# Patient Record
Sex: Female | Born: 1965 | Hispanic: No | Marital: Married | Smoking: Never smoker
Health system: Southern US, Community
[De-identification: ages and names within clinical notes are randomized; demographics above are authoritative.]

## PROBLEM LIST (undated history)

## (undated) DIAGNOSIS — Z923 Personal history of irradiation: Secondary | ICD-10-CM

## (undated) DIAGNOSIS — C50311 Malignant neoplasm of lower-inner quadrant of right female breast: Principal | ICD-10-CM

## (undated) DIAGNOSIS — Z8709 Personal history of other diseases of the respiratory system: Secondary | ICD-10-CM

## (undated) HISTORY — DX: Malignant neoplasm of lower-inner quadrant of right female breast: C50.311

---

## 2015-10-05 ENCOUNTER — Other Ambulatory Visit: Payer: Self-pay | Admitting: Radiology

## 2015-10-10 ENCOUNTER — Encounter: Payer: Self-pay | Admitting: *Deleted

## 2015-10-10 ENCOUNTER — Telehealth: Payer: Self-pay | Admitting: *Deleted

## 2015-10-10 DIAGNOSIS — C50311 Malignant neoplasm of lower-inner quadrant of right female breast: Secondary | ICD-10-CM

## 2015-10-10 DIAGNOSIS — Z853 Personal history of malignant neoplasm of breast: Secondary | ICD-10-CM | POA: Insufficient documentation

## 2015-10-10 HISTORY — DX: Malignant neoplasm of lower-inner quadrant of right female breast: C50.311

## 2015-10-10 NOTE — Telephone Encounter (Signed)
Confirmed [facility] for 10/12/15 at 0815.  Instructions and contact information given.

## 2015-10-12 ENCOUNTER — Encounter: Payer: Self-pay | Admitting: Hematology

## 2015-10-12 ENCOUNTER — Telehealth: Payer: Self-pay | Admitting: Hematology

## 2015-10-12 ENCOUNTER — Ambulatory Visit: Payer: 59 | Admitting: Physical Therapy

## 2015-10-12 ENCOUNTER — Telehealth: Payer: Self-pay | Admitting: *Deleted

## 2015-10-12 ENCOUNTER — Other Ambulatory Visit: Payer: 59

## 2015-10-12 ENCOUNTER — Ambulatory Visit (HOSPITAL_BASED_OUTPATIENT_CLINIC_OR_DEPARTMENT_OTHER): Payer: 59

## 2015-10-12 ENCOUNTER — Ambulatory Visit: Payer: 59 | Admitting: Radiation Oncology

## 2015-10-12 ENCOUNTER — Ambulatory Visit (HOSPITAL_BASED_OUTPATIENT_CLINIC_OR_DEPARTMENT_OTHER): Payer: 59 | Admitting: Hematology

## 2015-10-12 VITALS — BP 104/64 | HR 89 | Temp 98.0°F | Resp 18 | Wt 102.8 lb

## 2015-10-12 DIAGNOSIS — C50311 Malignant neoplasm of lower-inner quadrant of right female breast: Secondary | ICD-10-CM

## 2015-10-12 LAB — COMPREHENSIVE METABOLIC PANEL
ALT: 18 U/L (ref 0–55)
ANION GAP: 9 meq/L (ref 3–11)
AST: 19 U/L (ref 5–34)
Albumin: 3.8 g/dL (ref 3.5–5.0)
Alkaline Phosphatase: 55 U/L (ref 40–150)
BILIRUBIN TOTAL: 0.65 mg/dL (ref 0.20–1.20)
BUN: 15.4 mg/dL (ref 7.0–26.0)
CO2: 25 meq/L (ref 22–29)
CREATININE: 0.7 mg/dL (ref 0.6–1.1)
Calcium: 9.1 mg/dL (ref 8.4–10.4)
Chloride: 105 mEq/L (ref 98–109)
EGFR: >90 mL/min/{1.73_m2} (ref >90)
Glucose: 115 mg/dl (ref 70–140)
Potassium: 4 mEq/L (ref 3.5–5.1)
Sodium: 139 mEq/L (ref 136–145)
TOTAL PROTEIN: 7.1 g/dL (ref 6.4–8.3)

## 2015-10-12 LAB — CBC WITH DIFFERENTIAL/PLATELET
BASO%: 0.2 % (ref 0.0–2.0)
Basophils Absolute: 0 10*3/uL (ref 0.0–0.1)
EOS%: 0.5 % (ref 0.0–7.0)
Eosinophils Absolute: 0 10*3/uL (ref 0.0–0.5)
HEMATOCRIT: 39.5 % (ref 34.8–46.6)
HGB: 14.1 g/dL (ref 11.6–15.9)
LYMPH%: 27.7 % (ref 14.0–49.7)
MCH: 33.4 pg (ref 25.1–34.0)
MCHC: 35.7 g/dL (ref 31.5–36.0)
MCV: 93.6 fL (ref 79.5–101.0)
MONO#: 0.3 10*3/uL (ref 0.1–0.9)
MONO%: 5.4 % (ref 0.0–14.0)
NEUT%: 66.2 % (ref 38.4–76.8)
NEUTROS ABS: 3.9 10*3/uL (ref 1.5–6.5)
PLATELETS: 156 10*3/uL (ref 145–400)
RBC: 4.22 10*6/uL (ref 3.70–5.45)
RDW: 12.4 % (ref 11.2–14.5)
WBC: 5.9 10*3/uL (ref 3.9–10.3)
lymph#: 1.6 10*3/uL (ref 0.9–3.3)

## 2015-10-12 MED ORDER — TAMOXIFEN CITRATE 20 MG PO TABS: 20.0000 mg | ORAL_TABLET | Freq: Every day | ORAL | 2 refills | Status: DC

## 2015-10-12 NOTE — Telephone Encounter (Signed)
Confirmed apt for MRI 9/28 at 845am.

## 2015-10-12 NOTE — Telephone Encounter (Signed)
Gave patient avs report and appointments for October. Central radiology will call re scan.  °

## 2015-10-12 NOTE — Progress Notes (Signed)
[place]  [phone] Fax:([phone]) [place] Note   Patient Care Team: Azucena Fallen, MD as PCP - General (Obstetrics and Gynecology) Kyung Rudd, MD as Consulting Physician (Radiation Oncology) Truitt Merle, MD as Consulting Physician (Hematology) Erroll Luna, MD as Consulting Physician (General Surgery) 10/13/2015  CHIEF COMPLAINTS/PURPOSE OF CONSULTATION:  Newly diagnosed right breast cancer  Oncology History   Breast cancer of lower-inner quadrant of right female breast [facility])   Staging form: Breast, AJCC 7th Edition   - Clinical stage from 10/04/2015: Stage IA (T1c, N0, M0) - Signed by Truitt Merle, MD on 10/12/2015      Breast cancer of lower-inner quadrant of right female breast ([facility])   10/04/2015 Mammogram    Diagnostic mammogram and US showed a 1.6cm lobulated irregular high density mass in the lower inner quadrant right breast, possible chest wall invasion and skin involvement, right axillar [place] (-)      10/05/2015 Initial Diagnosis    Breast cancer of lower-inner quadrant of right female breast ([place])      10/05/2015 Initial Biopsy    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       10/05/2015 Receptors her2    ER 90% +, PR 95%+, strong staining, Her2-, Ki67 10%       HISTORY OF PRESENTING ILLNESS:  Shelly Smith 50 y.o. female is here because of Her recently diagnosed right breast cancer. She is accompanied by her husband to my clinic today.  She noticed a small right breast nodule below the nipple in the summer 2016, no nipple discharge, skin change, pain or other symptoms. She saw her gynecologist a few months later, and breast exam did confirm the breast nodule. Mammogram was ordered, but was not scheduled. She noticed some in skin retraction in the area in earlier this year. She finally scheduled for her mammogram a few weeks ago, which showed a 1.6 cm lobulated mass in the right breast lower inner  quadrant. She underwent ultrasound-guided core needle biopsy the next day, which showed invasive lobular carcinoma, ER/PR strongly positive, HER-2 negative. She was referred to [place] for further management.  She feels well overall. No pain, or other discomfort. She denies any weight loss, change of appetite or energy level lately. She is to have mammograms every other year, no family history of breast cancer or personal history of breast disease or biopsy. She is married, lives with her husband, the relocated from [place] to [place] last spring. She has 2 adult daughters. She is oriented in from [place].   GYN HISTORY  Menarchal: 13 LMP: 08/2015 Contraceptive: no  HRT: no  G2P2: two daughters 18 and 21 yo     MEDICAL HISTORY:  Past Medical History:  Diagnosis Date  . Breast cancer of lower-inner quadrant of right female breast ([place]) 10/10/2015    SURGICAL HISTORY: History reviewed. No pertinent surgical history.  SOCIAL HISTORY: Social History   Social History  . Marital status: Married    Spouse name: N/A  . Number of children: N/A  . Years of education: N/A   Occupational History  . Not on file.   Social History Main Topics  . Smoking status: Never Smoker  . Smokeless tobacco: Never Used  . Alcohol use Not on file     Comment: social drinker   . Drug use: No  . Sexual activity: Not on file   Other Topics Concern  . Not on file   Social  History Narrative  . No narrative on file    FAMILY HISTORY: Family History  Problem Relation Age of Onset  . Hypertension Mother   . Hypertension Father   . Cancer Cousin     brain tumor     ALLERGIES:  has no allergies on file.  MEDICATIONS:  Current Outpatient Prescriptions  Medication Sig Dispense Refill  . tamoxifen (NOLVADEX) 20 MG tablet Take 1 tablet (20 mg total) by mouth daily. 30 tablet 2   No current facility-administered medications for this visit.     REVIEW OF SYSTEMS:   Constitutional: Denies fevers,  chills or abnormal night sweats Eyes: Denies blurriness of vision, double vision or watery eyes Ears, nose, mouth, throat, and face: Denies mucositis or sore throat Respiratory: Denies cough, dyspnea or wheezes Cardiovascular: Denies palpitation, chest discomfort or lower extremity swelling Gastrointestinal:  Denies nausea, heartburn or change in bowel habits Skin: Denies abnormal skin rashes Lymphatics: Denies new lymphadenopathy or easy bruising Neurological:Denies numbness, tingling or new weaknesses Behavioral/Psych: Mood is stable, no new changes  All other systems were reviewed with the patient and are negative.  PHYSICAL EXAMINATION: ECOG PERFORMANCE STATUS: 0 - Asymptomatic  Vitals:   10/12/15 1333  BP: 104/64  Pulse: 89  Resp: 18  Temp: 98 F (36.7 C)   Filed Weights   10/12/15 1333  Weight: 102 lb 12.8 oz (46.6 kg)    GENERAL:alert, no distress and comfortable SKIN: skin color, texture, turgor are normal, no rashes or significant lesions EYES: normal, conjunctiva are pink and non-injected, sclera clear OROPHARYNX:no exudate, no erythema and lips, buccal mucosa, and tongue normal  NECK: supple, thyroid normal size, non-tender, without nodularity LYMPH:  no palpable lymphadenopathy in the cervical, axillary or inguinal LUNGS: clear to auscultation and percussion with normal breathing effort HEART: regular rate & rhythm and no murmurs and no lower extremity edema ABDOMEN:abdomen soft, non-tender and normal bowel sounds Musculoskeletal:no cyanosis of digits and no clubbing  PSYCH: alert & oriented x 3 with fluent speech NEURO: no focal motor/sensory deficits Breasts: Breast inspection showed them to be symmetrical with no nipple discharge. Palpation of the breasts and axilla revealed a 1.0X1.5cm mass below the right breast nipple, firm, no completely movable, slightly tender, (+) skin bruise at the biopsy site, no other obvious mass that I could appreciate.  LABORATORY  DATA:  I have reviewed the data as listed CBC Latest Ref Rng & Units 10/12/2015  WBC 3.9 - 10.3 10e3/uL 5.9  Hemoglobin 11.6 - 15.9 g/dL 14.1  Hematocrit 34.8 - 46.6 % 39.5  Platelets 145 - 400 10e3/uL 156   CMP Latest Ref Rng & Units 10/12/2015  Glucose 70 - 140 mg/dl 115  BUN 7.0 - 26.0 mg/dL 15.4  Creatinine 0.6 - 1.1 mg/dL 0.7  Sodium 136 - 145 mEq/L 139  Potassium 3.5 - 5.1 mEq/L 4.0  CO2 22 - 29 mEq/L 25  Calcium 8.4 - 10.4 mg/dL 9.1  Total Protein 6.4 - 8.3 g/dL 7.1  Total Bilirubin 0.20 - 1.20 mg/dL 0.65  Alkaline Phos 40 - 150 U/L 55  AST 5 - 34 U/L 19  ALT 0 - 55 U/L 18    RADIOGRAPHIC STUDIES: I have personally reviewed the radiological images as listed and agreed with the findings in the report.  Mr Breast Bilateral W Wo Contrast  Result Date: 10/13/2015 CLINICAL DATA:  Patient with recent diagnosis of right breast cancer. For further evaluation. EXAM: BILATERAL BREAST MRI WITH AND WITHOUT CONTRAST TECHNIQUE: Multiplanar, multisequence MR  images of both breasts were obtained prior to and following the intravenous administration of 9 ml of MultiHance. THREE-DIMENSIONAL MR IMAGE RENDERING ON INDEPENDENT WORKSTATION: Three-dimensional MR images were rendered by post-processing of the original MR data on an independent workstation. The three-dimensional MR images were interpreted, and findings are reported in the following complete MRI report for this study. Three dimensional images were evaluated at the independent Dyn[employer] workstation COMPARISON:  Previous exam(s). FINDINGS: Breast composition: c.  Heterogeneous fibroglandular tissue. Background parenchymal enhancement: Mild Right breast: Within the lower outer right breast there is a 1.7 x 1.7 x 1.0 cm irregular enhancing mass. There a few small spicules extending posteriorly from this mass toward the pectoralis muscle. No definitive pectoralis muscle enhancement is identified. There is internal susceptibility artifact within this  mass compatible with associated biopsy marking clip. Within the lateral aspect of the right breast slightly inferiorly, middle to posterior depth there is a 2.0 x 1.4 x 3.6 cm area of non mass enhancement. Left breast: No mass or abnormal enhancement. Lymph nodes: No abnormal appearing lymph nodes. Ancillary findings:  None. IMPRESSION: Irregular enhancing mass within the lower outer right breast compatible with recently biopsied right breast carcinoma. There a few small spicules of this mass extending posteriorly toward the pectoralis muscle. No definitive pectoralis muscle enhancement to suggest invasion identified on current examination. Within the lateral aspect of the right breast there is a 3.6 cm area of suspicious non mass enhancement. RECOMMENDATION: MRI guided core needle biopsy suspicious non mass enhancement within the lateral aspect of the right breast. Treatment plan for known right breast malignancy. BI-RADS CATEGORY  4: Suspicious. Electronically Signed   By: Lovey Newcomer M.D.   On: 10/13/2015 13:25    ASSESSMENT & PLAN: 50 year old premenopausal Shelly Smith woman, presented with a palpable right breast mass for one year.  1. Breast cancer of the lower inner quadrant of right female breast, cT1cN0M0, stage IA, ER/PR strongly positive, HER-2 negative, grade 2 ---We discussed her imaging findings and the biopsy results in great details. -Due to suspicion of her tumor invading pectoralis muscle, I recommend to obtain a breast MRI with and without contrast for further evaluation. This is scheduled for tomorrow morning -I recommend a Oncotype Dx test on the biopsy or surgical sample and we'll make a decision about neoadjuvant or adjuvant chemotherapy based on the Oncotype result. Written material of this test was given to her. She is young and fit, would be a good candidate for chemotherapy if her Oncotype recurrence score is high. -If her surgical sentinel lymph node node positive, I recommend  mammaprint for further risk stratification and guide adjuvant chemotherapy. --Giving her strongly ER and PR positivity of the tumor cells, and her pre-menopause status, I recommend adjuvant endocrine therapy with tamoxifen. The potential side effects, which includes but not limited to, hot flash, skin and vaginal dryness, slightly increased risk of cardiovascular disease and cataract, small risk of thrombosis and endometrial cancer, were discussed with her in great details. Preventive strategies for thrombosis, such as being physically active, using compression stocks, avoid cigarette smoking, etc., were reviewed with her. I also recommend her to follow-up with her gynecologist once a year, and watch for vaginal spotting or bleeding, as a clinically sign of endometrial cancer, etc. She voiced good understanding, and agrees to proceed. -Alternative adjuvant endocrine therapy, such as aromatase inhibitor and overexpression were discussed with her also, given her young age. Due to her very early stage breast cancer, a do not strongly feel  this is necessary. -She has a trip to [place] scheduled for next week, she will return in 3 weeks. She will see her surgeon Dr. Donne Hazel after she returns. -I recommend her to start tamoxifen in next few days, while she is waiting for surgery. She agrees. -We discussed the role of adjuvant breast radiation. We'll refer if she needs radiation. -We also reviewed the breast cancer surveillance, including annual mammogram, and physical exam.  -Her today's lab results reviewed with her, she has normal CBC and CMP.   Plan -breast MRI tomorrow  -start tamoxifen 20 min one daily in the next few days. Prescription was sent to her pharmacy. -I plan to see her back in 4 weeks with lab    Orders Placed This Encounter  Procedures  . MR Breast Bilateral W Wo Contrast    Standing Status:   Future    Number of Occurrences:   1    Standing Expiration Date:   12/11/2016    Order  Specific Question:   If indicated for the ordered procedure, I authorize the administration of contrast media per Radiology protocol    Answer:   Yes    Order Specific Question:   Reason for Exam (SYMPTOM  OR DIAGNOSIS REQUIRED)    Answer:   breast cancer, evaluate local invasion (chest wall and skin)    Order Specific Question:   Preferred imaging location?    Answer:   [facility] (table limit-350 lbs)    Order Specific Question:   What is the patient's sedation requirement?    Answer:   No Sedation    Order Specific Question:   Does the patient have a pacemaker or implanted devices?    Answer:   No    All questions were answered. The patient knows to call the clinic with any problems, questions or concerns. I spent 60 minutes counseling the patient face to face. The total time spent in the appointment was 70 minutes and more than 50% was on counseling.     Truitt Merle, MD 10/12/2015   Addendum Her MRI of the breast was done this morning. I reviewed the results with patient. I recommend her to have MRI guided breast biopsy of the non-mass enhancement in the right breast. But if she prefers mastectomy anyway, I think is acceptable not to biopsy the second lesion. She will discuss with her husband, and let me know tomorrow morning.  Truitt Merle  10/13/2015

## 2015-10-13 ENCOUNTER — Ambulatory Visit (HOSPITAL_COMMUNITY)
Admission: RE | Admit: 2015-10-13 | Discharge: 2015-10-13 | Disposition: A | Discharge status: Home / Self Care | Payer: 59 | Source: Ambulatory Visit | Attending: Hematology | Admitting: Hematology

## 2015-10-13 ENCOUNTER — Telehealth: Payer: Self-pay | Admitting: *Deleted

## 2015-10-13 ENCOUNTER — Encounter: Payer: Self-pay | Admitting: *Deleted

## 2015-10-13 ENCOUNTER — Encounter: Payer: Self-pay | Admitting: Hematology

## 2015-10-13 DIAGNOSIS — C50311 Malignant neoplasm of lower-inner quadrant of right female breast: Secondary | ICD-10-CM | POA: Insufficient documentation

## 2015-10-13 MED ORDER — GADOBENATE DIMEGLUMINE 529 MG/ML IV SOLN
10.0000 mL | Freq: Once | INTRAVENOUS | Status: AC | PRN
  Administered 2015-10-13: 9 mL via INTRAVENOUS

## 2015-10-13 NOTE — Telephone Encounter (Signed)
Received order for Oncotype Testing. Requisition sent to pathology. 

## 2015-10-14 ENCOUNTER — Telehealth: Payer: Self-pay | Admitting: *Deleted

## 2015-10-14 ENCOUNTER — Other Ambulatory Visit: Payer: Self-pay | Admitting: *Deleted

## 2015-10-14 DIAGNOSIS — C50311 Malignant neoplasm of lower-inner quadrant of right female breast: Secondary | ICD-10-CM

## 2015-10-14 NOTE — Telephone Encounter (Signed)
Received MRI bx appt for 11/02/15 and appt with Dr. Donne Hazel on 11/04/15. Gave pt appts as well as instructions and directions. Pt will be leaving for [place] on 10/18/15 and returning on 11/01/15. Denies further needs at this time.

## 2015-10-14 NOTE — Telephone Encounter (Signed)
"  I need to speak with Dr. Burr Medico or her nurse."  Call transferred ext [phone].

## 2015-10-19 ENCOUNTER — Other Ambulatory Visit: Payer: 59

## 2015-10-20 ENCOUNTER — Encounter (HOSPITAL_COMMUNITY): Payer: Self-pay

## 2015-10-21 ENCOUNTER — Telehealth: Payer: Self-pay | Admitting: *Deleted

## 2015-10-21 NOTE — Telephone Encounter (Signed)
Received Oncotype score of 19 Physician team notified

## 2015-10-24 ENCOUNTER — Other Ambulatory Visit: Payer: Self-pay | Admitting: Hematology

## 2015-10-24 DIAGNOSIS — C50311 Malignant neoplasm of lower-inner quadrant of right female breast: Secondary | ICD-10-CM

## 2015-11-02 ENCOUNTER — Ambulatory Visit
Admission: RE | Admit: 2015-11-02 | Discharge: 2015-11-02 | Disposition: A | Discharge status: Home / Self Care | Payer: 59 | Source: Ambulatory Visit | Attending: Hematology | Admitting: Hematology

## 2015-11-02 DIAGNOSIS — C50311 Malignant neoplasm of lower-inner quadrant of right female breast: Secondary | ICD-10-CM

## 2015-11-02 MED ORDER — GADOBENATE DIMEGLUMINE 529 MG/ML IV SOLN
9.0000 mL | Freq: Once | INTRAVENOUS | Status: AC | PRN
  Administered 2015-11-02: 9 mL via INTRAVENOUS

## 2015-11-04 ENCOUNTER — Ambulatory Visit (HOSPITAL_BASED_OUTPATIENT_CLINIC_OR_DEPARTMENT_OTHER): Payer: 59 | Admitting: Hematology

## 2015-11-04 ENCOUNTER — Other Ambulatory Visit: Payer: Self-pay | Admitting: General Surgery

## 2015-11-04 VITALS — BP 108/74 | HR 69 | Temp 97.8°F | Resp 18 | Wt 101.9 lb

## 2015-11-04 DIAGNOSIS — Z170 Estrogen receptor positive status [ER+]: Secondary | ICD-10-CM

## 2015-11-04 DIAGNOSIS — Z79811 Long term (current) use of aromatase inhibitors: Secondary | ICD-10-CM

## 2015-11-04 DIAGNOSIS — C50311 Malignant neoplasm of lower-inner quadrant of right female breast: Secondary | ICD-10-CM

## 2015-11-04 NOTE — Progress Notes (Signed)
[place]  [phone] [phone]  Clinic follow up Note   Patient Care Team: Azucena Fallen, MD as PCP - General (Obstetrics and Gynecology) Kyung Rudd, MD as Consulting Physician (Radiation Oncology) Truitt Merle, MD as Consulting Physician (Hematology) Erroll Luna, MD as Consulting Physician (General Surgery) 11/04/2015  CHIEF COMPLAINT:  Follow up right breast cancer  Oncology History   Breast cancer of lower-inner quadrant of right female breast [facility])   Staging form: Breast, AJCC 7th Edition   - Clinical stage from 10/04/2015: Stage IA (T1c, N0, M0) - Signed by Truitt Merle, MD on 10/12/2015      Breast cancer of lower-inner quadrant of right female breast ([place])   10/04/2015 Mammogram    Diagnostic mammogram and US showed a 1.6cm lobulated irregular high density mass in the lower inner quadrant right breast, possible chest wall invasion and skin involvement, right axillar [place] (-)      10/05/2015 Initial Diagnosis    Breast cancer of lower-inner quadrant of right female breast ([place])      10/05/2015 Initial Biopsy    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       10/05/2015 Receptors her2    ER 90% +, PR 95%+, strong staining, Her2-, Ki67 10%      10/13/2015 Imaging    Bilateral breast MRI with and without contrast showed the known 1.7 x 1.7 x 1.0 cm enhancing mass, and a 2.0 x 1.4 x 3.6 cm area of non-mass enhancement. Left breast was negative. Lymph nodes appear normal.      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 female is here because of Her recently diagnosed right breast cancer. She is accompanied by her husband to my clinic today.  She noticed a small right breast nodule  below the nipple in the summer 2016, no nipple discharge, skin change, pain or other symptoms. She saw her gynecologist a few months later, and breast exam did confirm the breast nodule. Mammogram was ordered, but was not scheduled. She noticed some in skin retraction in the area in earlier this year. She finally scheduled for her mammogram a few weeks ago, which showed a 1.6 cm lobulated mass in the right breast lower inner quadrant. She underwent ultrasound-guided core needle biopsy the next day, which showed invasive lobular carcinoma, ER/PR strongly positive, HER-2 negative. She was referred to [place] for further management.  She feels well overall. No pain, or other discomfort. She denies any weight loss, change of appetite or energy level lately. She is to have mammograms every other year, no family history of breast cancer or personal history of breast disease or biopsy. She is married, lives with her husband, the relocated from [place] to [place] last spring. She has 2 adult daughters. She is oriented in from [place].   GYN HISTORY  Menarchal: 13 LMP: 08/2015 Contraceptive: no  HRT: no  G2P2: two daughters 30 and 60 yo   CURRENT THERAPY: Tamoxifen 20 mg once daily, started on 10/13/2015  INTERIM HISTORY:  Shelly Smith returns for follow-up. She is accompanied by her husband to clinic today. She came back from her trip to [place] last weekend. She underwent the second right  breast biopsy. This ago, and was seen by breast surgeon Dr. Donne Hazel this morning. She feels well, denies any other new symptoms. She has not had. Since August 2017.   MEDICAL HISTORY:  Past Medical History:  Diagnosis Date  . Breast cancer of lower-inner quadrant of right female breast ([place]) 10/10/2015    SURGICAL HISTORY: No past surgical history on file.  SOCIAL HISTORY: Social History   Social History  . Marital status: Married    Spouse name: N/A  . Number of children: N/A  . Years of education: N/A    Occupational History  . Not on file.   Social History Main Topics  . Smoking status: Never Smoker  . Smokeless tobacco: Never Used  . Alcohol use Not on file     Comment: social drinker   . Drug use: No  . Sexual activity: Not on file   Other Topics Concern  . Not on file   Social History Narrative  . No narrative on file    FAMILY HISTORY: Family History  Problem Relation Age of Onset  . Hypertension Mother   . Hypertension Father   . Cancer Cousin     brain tumor     ALLERGIES:  has no allergies on file.  MEDICATIONS:  Current Outpatient Prescriptions  Medication Sig Dispense Refill  . tamoxifen (NOLVADEX) 20 MG tablet Take 1 tablet (20 mg total) by mouth daily. 30 tablet 2   No current facility-administered medications for this visit.     REVIEW OF SYSTEMS:   Constitutional: Denies fevers, chills or abnormal night sweats Eyes: Denies blurriness of vision, double vision or watery eyes Ears, nose, mouth, throat, and face: Denies mucositis or sore throat Respiratory: Denies cough, dyspnea or wheezes Cardiovascular: Denies palpitation, chest discomfort or lower extremity swelling Gastrointestinal:  Denies nausea, heartburn or change in bowel habits Skin: Denies abnormal skin rashes Lymphatics: Denies new lymphadenopathy or easy bruising Neurological:Denies numbness, tingling or new weaknesses Behavioral/Psych: Mood is stable, no new changes  All other systems were reviewed with the patient and are negative.  PHYSICAL EXAMINATION: ECOG PERFORMANCE STATUS: 0 - Asymptomatic  Vitals:   11/04/15 1245  BP: 108/74  Pulse: 69  Resp: 18  Temp: 97.8 F (36.6 C)   Filed Weights   11/04/15 1245  Weight: 101 lb 14.4 oz (46.2 kg)    GENERAL:alert, no distress and comfortable SKIN: skin color, texture, turgor are normal, no rashes or significant lesions EYES: normal, conjunctiva are pink and non-injected, sclera clear OROPHARYNX:no exudate, no erythema and  lips, buccal mucosa, and tongue normal  NECK: supple, thyroid normal size, non-tender, without nodularity LYMPH:  no palpable lymphadenopathy in the cervical, axillary or inguinal LUNGS: clear to auscultation and percussion with normal breathing effort HEART: regular rate & rhythm and no murmurs and no lower extremity edema ABDOMEN:abdomen soft, non-tender and normal bowel sounds Musculoskeletal:no cyanosis of digits and no clubbing  PSYCH: alert & oriented x 3 with fluent speech NEURO: no focal motor/sensory deficits Breasts: Breast inspection showed them to be symmetrical with no nipple discharge. Palpation of the breasts and axilla revealed a 1.0X1.5cm mass below the right breast nipple, firm, no completely movable, slightly tender, (+) skin bruise at the biopsy site, no other obvious mass that I could appreciate.  LABORATORY DATA:  I have reviewed the data as listed CBC Latest Ref Rng & Units 10/12/2015  WBC 3.9 - 10.3 10e3/uL 5.9  Hemoglobin 11.6 - 15.9 g/dL 14.1  Hematocrit 34.8 - 46.6 %  39.5  Platelets 145 - 400 10e3/uL 156   CMP Latest Ref Rng & Units 10/12/2015  Glucose 70 - 140 mg/dl 115  BUN 7.0 - 26.0 mg/dL 15.4  Creatinine 0.6 - 1.1 mg/dL 0.7  Sodium 136 - 145 mEq/L 139  Potassium 3.5 - 5.1 mEq/L 4.0  CO2 22 - 29 mEq/L 25  Calcium 8.4 - 10.4 mg/dL 9.1  Total Protein 6.4 - 8.3 g/dL 7.1  Total Bilirubin 0.20 - 1.20 mg/dL 0.65  Alkaline Phos 40 - 150 U/L 55  AST 5 - 34 U/L 19  ALT 0 - 55 U/L 18   PATHOLOGY REPORT  Diagnosis 11/02/2015 Breast, right, needle core biopsy, lateral - 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 - SEE COMMENT. Microscopic Comment There is a small focus of atypical ductal hyperplasia, supported by a positive stain for E-Cadherin and a negative stain for cytokeratin 5/6. The results were called to the [place] [facility] on 11/03/2015  Diagnosis 10/05/2015 Breast, right, needle  core biopsy INVASIVE DUCTAL CARCINOMA, GRADE 2 Results: IMMUNOHISTOCHEMICAL AND MORPHOMETRIC ANALYSIS PERFORMED MANUALLY Estrogen Receptor: 90%, POSITIVE, STRONG STAINING INTENSITY Progesterone Receptor: 95%, POSITIVE, STRONG STAINING INTENSITY Proliferation Marker Ki67: 10% Results: HER2 - NEGATIVE RATIO OF HER2/CEP17 SIGNALS 1.42 AVERAGE HER2 COPY NUMBER PER CELL 2.20  ONCOTYPE DX RS 19, predicts 10 year distance recurrence risk of 12% with tamoxifen   RADIOGRAPHIC STUDIES: I have personally reviewed the radiological images as listed and agreed with the findings in the report.  Mr Breast Bilateral W Wo Contrast  Result Date: 10/13/2015 CLINICAL DATA:  Patient with recent diagnosis of right breast cancer. For further evaluation. EXAM: BILATERAL BREAST MRI WITH AND WITHOUT CONTRAST TECHNIQUE: Multiplanar, multisequence MR images of both breasts were obtained prior to and following the intravenous administration of 9 ml of MultiHance. THREE-DIMENSIONAL MR IMAGE RENDERING ON INDEPENDENT WORKSTATION: Three-dimensional MR images were rendered by post-processing of the original MR data on an independent workstation. The three-dimensional MR images were interpreted, and findings are reported in the following complete MRI report for this study. Three dimensional images were evaluated at the independent DynaCad workstation COMPARISON:  Previous exam(s). FINDINGS: Breast composition: c.  Heterogeneous fibroglandular tissue. Background parenchymal enhancement: Mild Right breast: Within the lower outer right breast there is a 1.7 x 1.7 x 1.0 cm irregular enhancing mass. There a few small spicules extending posteriorly from this mass toward the pectoralis muscle. No definitive pectoralis muscle enhancement is identified. There is internal susceptibility artifact within this mass compatible with associated biopsy marking clip. Within the lateral aspect of the right breast slightly inferiorly, middle to  posterior depth there is a 2.0 x 1.4 x 3.6 cm area of non mass enhancement. Left breast: No mass or abnormal enhancement. Lymph nodes: No abnormal appearing lymph nodes. Ancillary findings:  None. IMPRESSION: Irregular enhancing mass within the lower outer right breast compatible with recently biopsied right breast carcinoma. There a few small spicules of this mass extending posteriorly toward the pectoralis muscle. No definitive pectoralis muscle enhancement to suggest invasion identified on current examination. Within the lateral aspect of the right breast there is a 3.6 cm area of suspicious non mass enhancement. RECOMMENDATION: MRI guided core needle biopsy suspicious non mass enhancement within the lateral aspect of the right breast. Treatment plan for known right breast malignancy. BI-RADS CATEGORY  4: Suspicious. Electronically Signed   By: Lovey Newcomer M.D.   On: 10/13/2015 13:25   Mm Clip Placement Right  Result Date:  11/02/2015 CLINICAL DATA:  Known right breast cancer. Recent MRI showed non masslike enhancement lateral aspect of the breast. Status post MR guided core biopsy of the right breast. EXAM: DIAGNOSTIC RIGHT MAMMOGRAM POST MRI BIOPSY COMPARISON:  Previous exam(s). FINDINGS: Mammographic images were obtained following MR guided biopsy of non masslike enhancement in the lateral aspect of the right breast. Mammographic images show there is a dumbbell-shaped clip in the lateral aspect of the right breast. IMPRESSION: Status post MR guided core biopsy of the right breast with pathology pending. Final Assessment: Post Procedure Mammograms for Marker Placement Electronically Signed   By: Lillia Mountain M.D.   On: 11/02/2015 11:54   Mr Rt Breast Bx Johnella Moloney Dev 1st Lesion Image Bx Spec Mr Guide  Result Date: 11/02/2015 CLINICAL DATA:  Biopsy proven right breast cancer. Recent MRI showed non masslike enhancement in the lateral aspect of the right breast. EXAM: MRI GUIDED CORE NEEDLE BIOPSY OF THE RIGHT  BREAST TECHNIQUE: Multiplanar, multisequence MR imaging of the right breast was performed both before and after administration of intravenous contrast. CONTRAST:  70m MULTIHANCE GADOBENATE DIMEGLUMINE 529 MG/ML IV SOLN COMPARISON:  Previous exams. FINDINGS: I met with the patient, and we discussed the procedure of MRI guided biopsy, including risks, benefits, and alternatives. Specifically, we discussed the risks of infection, bleeding, tissue injury, clip migration, and inadequate sampling. Informed, written consent was given. The usual time out protocol was performed immediately prior to the procedure. Using sterile technique, 1% Lidocaine, MRI guidance, and a 9 gauge vacuum assisted device, biopsy was performed of non masslike enhancement in lateral aspect of the right breast using a lateral to medial approach. At the conclusion of the procedure, a dumbbell-shaped tissue marker clip was deployed into the biopsy cavity. Follow-up 2-view mammogram was performed and dictated separately. IMPRESSION: MRI guided biopsy of the right breast. No apparent complications. Electronically Signed   By: DLillia MountainM.D.   On: 11/02/2015 11:52    ASSESSMENT & PLAN: 50year old premenopausal [place], presented with a palpable right breast mass for one year.  1. Breast cancer of the lower inner quadrant of right female breast, cT1cN0M0, stage IA, ER/PR strongly positive, HER-2 negative, grade 2, Oncotype RS 19 ---We discussed her breast MRI findings, and her second right breast biopsy results. -She has seen I breast surgeon Dr. week., Who recommended right mastectomy and sentinel lymph node biopsy. Patient is agreeable with the plan, her surgery will be scheduled soon. -She will have a screening mammogram of her left breast before surgery -the Oncotype Dx result was reviewed with her in details. She has intermedia risk based on the recurrence score, which predicts 10 year distant recurrence after 5 years of tamoxifen  12%. The benefit of chemotherapy in the intermedia risk group is small and controversial. Given her relatively low score in the intermedia group, I did not recommend adjuvant chemotherapy. She agrees with the plan -She has started a new adjuvant tamoxifen, tolerating well. Due to slight increased risk of thrombosis from tamoxifen, I recommend her to stop tamoxifen. Days before surgery, and restarted a week after surgery. -We'll wait her final surgical past, to see if she need postmastectomy radiation. -We also reviewed the breast cancer surveillance, including annual mammogram, and physical exam.    Plan -Left breast mammogram -She will have right breast mastectomy and sentinel lymph node biopsy by Dr. WDonne Hazelsoon -She will continue tamoxifen, and hold it a few days before and one week after surgery -I plan to  see her back in 2-3 weeks after her breast surgery. -No adjuvant chemotherapy based on her Oncotype results  All questions were answered. The patient knows to call the clinic with any problems, questions or concerns. I spent 25 minutes counseling the patient face to face. The total time spent in the appointment was 30 minutes and more than 50% was on counseling.  Truitt Merle  11/04/2015

## 2015-11-05 ENCOUNTER — Other Ambulatory Visit: Payer: Self-pay | Admitting: General Surgery

## 2015-11-05 ENCOUNTER — Encounter: Payer: Self-pay | Admitting: Hematology

## 2015-11-05 DIAGNOSIS — Z853 Personal history of malignant neoplasm of breast: Secondary | ICD-10-CM

## 2015-11-09 ENCOUNTER — Ambulatory Visit: Payer: 59 | Admitting: Hematology

## 2015-11-11 ENCOUNTER — Other Ambulatory Visit: Payer: Self-pay | Admitting: General Surgery

## 2015-11-11 DIAGNOSIS — Z170 Estrogen receptor positive status [ER+]: Principal | ICD-10-CM

## 2015-11-11 DIAGNOSIS — C50311 Malignant neoplasm of lower-inner quadrant of right female breast: Secondary | ICD-10-CM

## 2015-11-15 NOTE — Pre-Procedure Instructions (Addendum)
Zhamira Rajski  11/15/2015      [facility] - [place], [place] - [address] [place] [address] [place] [address] [place] [address] [place] [address] [place] [address] Phone: [phone] Fax: [phone]    Your procedure is scheduled on Monday, November 6th, 2017.  Report to [facility] Admitting at 6:30 A.M.   Call this number if you have problems the morning of surgery:  [phone]   Remember:  Do not eat food or drink liquids after midnight.   Please drink Boost Breeze drink 2 hours prior to arrival to short stay.     Take these medicines the morning of surgery with A SIP OF WATER: None.     Stop taking: Aspirin, NSAIDS, Aleve, Naproxen, Ibuprofen, Advil, Motrin, BC's, Goody's, Fish oil, all herbal medications, and all vitamins.    Do not wear jewelry, make-up or nail polish.  Do not wear lotions, powders, or perfumes, or deoderant.  Do not shave 48 hours prior to surgery.    Do not bring valuables to the hospital.  [facility] is not responsible for any belongings or valuables.  Contacts, dentures or bridgework may not be worn into surgery.  Leave your suitcase in the car.  After surgery it may be brought to your room.  For patients admitted to the hospital, discharge time will be determined by your treatment team.  Patients discharged the day of surgery will not be allowed to drive home.   Special instructions:  Preparing for Surgery.   - Preparing For Surgery  Before surgery, you can play an important role. Because skin is not sterile, your skin needs to be as free of germs as possible. You can reduce the number of germs on your skin by washing with CHG (chlorahexidine gluconate) Soap before surgery.  CHG is an antiseptic cleaner which kills germs and bonds with the skin to continue killing germs even after washing.  Please do not use if you have an allergy to CHG or antibacterial soaps. If your skin becomes reddened/irritated stop  using the CHG.  Do not shave (including legs and underarms) for at least 48 hours prior to first CHG shower. It is OK to shave your face.  Please follow these instructions carefully.   1. Shower the NIGHT BEFORE SURGERY and the MORNING OF SURGERY with CHG.   2. If you chose to wash your hair, wash your hair first as usual with your normal shampoo.  3. After you shampoo, rinse your hair and body thoroughly to remove the shampoo.  4. Use CHG as you would any other liquid soap. You can apply CHG directly to the skin and wash gently with a scrungie or a clean washcloth.   5. Apply the CHG Soap to your body ONLY FROM THE NECK DOWN.  Do not use on open wounds or open sores. Avoid contact with your eyes, ears, mouth and genitals (private parts). Wash genitals (private parts) with your normal soap.  6. Wash thoroughly, paying special attention to the area where your surgery will be performed.  7. Thoroughly rinse your body with warm water from the neck down.  8. DO NOT shower/wash with your normal soap after using and rinsing off the CHG Soap.  9. Pat yourself dry with a CLEAN TOWEL.   10. Wear CLEAN PAJAMAS   11. Place CLEAN SHEETS on your bed the night of your first shower and DO NOT SLEEP WITH PETS.  Day of Surgery: Do not apply any deodorants/lotions. Please wear clean clothes to the hospital/surgery center.     Please read over the following fact sheets that you were given.

## 2015-11-16 ENCOUNTER — Encounter (HOSPITAL_COMMUNITY): Payer: Self-pay | Admitting: General Practice

## 2015-11-16 ENCOUNTER — Encounter (HOSPITAL_COMMUNITY)
Admission: RE | Admit: 2015-11-16 | Discharge: 2015-11-16 | Disposition: A | Discharge status: Home / Self Care | Payer: 59 | Source: Ambulatory Visit | Attending: General Surgery | Admitting: General Surgery

## 2015-11-16 DIAGNOSIS — Z01812 Encounter for preprocedural laboratory examination: Secondary | ICD-10-CM | POA: Diagnosis not present

## 2015-11-16 HISTORY — DX: Personal history of other diseases of the respiratory system: Z87.09

## 2015-11-16 LAB — CBC
HEMATOCRIT: 43.6 % (ref 36.0–46.0)
HEMOGLOBIN: 15.3 g/dL — AB (ref 12.0–15.0)
MCH: 33.5 pg (ref 26.0–34.0)
MCHC: 35.1 g/dL (ref 30.0–36.0)
MCV: 95.4 fL (ref 78.0–100.0)
Platelets: 128 10*3/uL — ABNORMAL LOW (ref 150–400)
RBC: 4.57 MIL/uL (ref 3.87–5.11)
RDW: 12.2 % (ref 11.5–15.5)
WBC: 3.8 10*3/uL — AB (ref 4.0–10.5)

## 2015-11-16 LAB — BASIC METABOLIC PANEL
ANION GAP: 4 — AB (ref 5–15)
BUN: 12 mg/dL (ref 6–20)
CHLORIDE: 110 mmol/L (ref 101–111)
CO2: 25 mmol/L (ref 22–32)
Calcium: 9.2 mg/dL (ref 8.9–10.3)
Creatinine, Ser: 0.62 mg/dL (ref 0.44–1.00)
GFR calc non Af Amer: >60 mL/min (ref >60)
Glucose, Bld: 95 mg/dL (ref 65–99)
POTASSIUM: 3.8 mmol/L (ref 3.5–5.1)
SODIUM: 139 mmol/L (ref 135–145)

## 2015-11-16 LAB — HCG, SERUM, QUALITATIVE: Preg, Serum: NEGATIVE

## 2015-11-16 NOTE — Progress Notes (Signed)
PCP - patient denies having a PCP  OB/GYN- Azucena Fallen Cardiologist - denies  EKG/CXR - denies Echo/Stress test/Cardiac Cath - denies  Patient denies chest pain and shortness of breath at PAT appointment.    Patient instructed to drink Boost Breeze 2 hours prior to arriving to short stay.

## 2015-11-20 NOTE — Anesthesia Preprocedure Evaluation (Addendum)
Anesthesia Evaluation  Patient identified by MRN, date of birth, ID band Patient awake    Reviewed: Allergy & Precautions, H&P , Patient's Chart, lab work & pertinent test results, reviewed documented beta blocker date and time   Airway Mallampati: II  TM Distance: >3 FB Neck ROM: full  Mouth opening: Limited Mouth Opening  Dental no notable dental hx.    Pulmonary    Pulmonary exam normal breath sounds clear to auscultation       Cardiovascular  Rhythm:regular Rate:Normal     Neuro/Psych    GI/Hepatic   Endo/Other    Renal/GU      Musculoskeletal   Abdominal   Peds  Hematology   Anesthesia Other Findings   Reproductive/Obstetrics                            Anesthesia Physical Anesthesia Plan  ASA: II  Anesthesia Plan:    Post-op Pain Management: GA combined w/ Regional for post-op pain   Induction: Intravenous  Airway Management Planned: LMA  Additional Equipment:   Intra-op Plan:   Post-operative Plan:   Informed Consent: I have reviewed the patients History and Physical, chart, labs and discussed the procedure including the risks, benefits and alternatives for the proposed anesthesia with the patient or authorized representative who has indicated his/her understanding and acceptance.   Dental Advisory Given  Plan Discussed with: CRNA and Surgeon  Anesthesia Plan Comments: (Discussed GA with LMA, possible sore throat, potential need to switch to ETT, N/V, pulmonary aspiration. Questions answered. )        Anesthesia Quick Evaluation

## 2015-11-21 ENCOUNTER — Ambulatory Visit (HOSPITAL_COMMUNITY)
Admission: RE | Admit: 2015-11-21 | Discharge: 2015-11-21 | Disposition: A | Discharge status: Home / Self Care | Payer: 59 | Source: Ambulatory Visit | Attending: General Surgery | Admitting: General Surgery

## 2015-11-21 ENCOUNTER — Ambulatory Visit (HOSPITAL_COMMUNITY): Payer: 59 | Admitting: Certified Registered Nurse Anesthetist

## 2015-11-21 ENCOUNTER — Encounter (HOSPITAL_COMMUNITY): Payer: 59

## 2015-11-21 ENCOUNTER — Observation Stay (HOSPITAL_COMMUNITY)
Admission: RE | Admit: 2015-11-21 | Discharge: 2015-11-22 | Disposition: A | Discharge status: Home / Self Care | Payer: 59 | Source: Ambulatory Visit | Attending: General Surgery | Admitting: General Surgery

## 2015-11-21 ENCOUNTER — Encounter (HOSPITAL_COMMUNITY): Payer: Self-pay | Admitting: Certified Registered"

## 2015-11-21 ENCOUNTER — Encounter (HOSPITAL_COMMUNITY)
Admission: RE | Disposition: A | Discharge status: Home / Self Care | Payer: Self-pay | Source: Ambulatory Visit | Attending: General Surgery

## 2015-11-21 DIAGNOSIS — C773 Secondary and unspecified malignant neoplasm of axilla and upper limb lymph nodes: Secondary | ICD-10-CM | POA: Diagnosis not present

## 2015-11-21 DIAGNOSIS — Z79810 Long term (current) use of selective estrogen receptor modulators (SERMs): Secondary | ICD-10-CM | POA: Diagnosis not present

## 2015-11-21 DIAGNOSIS — Z9889 Other specified postprocedural states: Secondary | ICD-10-CM | POA: Diagnosis not present

## 2015-11-21 DIAGNOSIS — Z170 Estrogen receptor positive status [ER+]: Secondary | ICD-10-CM | POA: Diagnosis not present

## 2015-11-21 DIAGNOSIS — Z8249 Family history of ischemic heart disease and other diseases of the circulatory system: Secondary | ICD-10-CM | POA: Diagnosis not present

## 2015-11-21 DIAGNOSIS — C50911 Malignant neoplasm of unspecified site of right female breast: Secondary | ICD-10-CM | POA: Diagnosis present

## 2015-11-21 DIAGNOSIS — Z823 Family history of stroke: Secondary | ICD-10-CM | POA: Insufficient documentation

## 2015-11-21 DIAGNOSIS — C50311 Malignant neoplasm of lower-inner quadrant of right female breast: Secondary | ICD-10-CM

## 2015-11-21 DIAGNOSIS — D0511 Intraductal carcinoma in situ of right breast: Secondary | ICD-10-CM | POA: Diagnosis not present

## 2015-11-21 HISTORY — PX: MASTECTOMY W/ SENTINEL NODE BIOPSY: SHX2001

## 2015-11-21 SURGERY — MASTECTOMY WITH SENTINEL LYMPH NODE BIOPSY
Anesthesia: General | Site: Breast | Laterality: Right

## 2015-11-21 MED ORDER — GABAPENTIN 300 MG PO CAPS
300.0000 mg | ORAL_CAPSULE | ORAL | Status: AC
  Administered 2015-11-21: 300 mg via ORAL

## 2015-11-21 MED ORDER — ACETAMINOPHEN 500 MG PO TABS
1000.0000 mg | ORAL_TABLET | ORAL | Status: AC
  Administered 2015-11-21: 1000 mg via ORAL

## 2015-11-21 MED ORDER — DEXAMETHASONE SODIUM PHOSPHATE 10 MG/ML IJ SOLN
INTRAMUSCULAR | Status: AC
  Filled 2015-11-21: qty 1

## 2015-11-21 MED ORDER — HEMOSTATIC AGENTS (NO CHARGE) OPTIME
TOPICAL | Status: DC | PRN
  Administered 2015-11-21: 1 via TOPICAL

## 2015-11-21 MED ORDER — ONDANSETRON HCL 4 MG/2ML IJ SOLN: 4.0000 mg | Freq: Four times a day (QID) | INTRAMUSCULAR | Status: DC | PRN

## 2015-11-21 MED ORDER — MIDAZOLAM HCL 2 MG/2ML IJ SOLN
INTRAMUSCULAR | Status: AC
  Filled 2015-11-21: qty 2

## 2015-11-21 MED ORDER — OXYCODONE HCL 5 MG PO TABS
5.0000 mg | ORAL_TABLET | ORAL | Status: DC | PRN
  Filled 2015-11-21: qty 2

## 2015-11-21 MED ORDER — CEFAZOLIN SODIUM-DEXTROSE 2-4 GM/100ML-% IV SOLN
INTRAVENOUS | Status: AC
  Filled 2015-11-21: qty 100

## 2015-11-21 MED ORDER — PROPOFOL 10 MG/ML IV BOLUS
INTRAVENOUS | Status: DC | PRN
  Administered 2015-11-21: 90 mg via INTRAVENOUS

## 2015-11-21 MED ORDER — CEFAZOLIN SODIUM-DEXTROSE 2-4 GM/100ML-% IV SOLN
2.0000 g | INTRAVENOUS | Status: AC
  Administered 2015-11-21: 2 g via INTRAVENOUS

## 2015-11-21 MED ORDER — TECHNETIUM TC 99M SULFUR COLLOID FILTERED
1.0000 | Freq: Once | INTRAVENOUS | Status: AC | PRN
  Administered 2015-11-21: 1 via INTRADERMAL

## 2015-11-21 MED ORDER — METHYLENE BLUE 0.5 % INJ SOLN
INTRAVENOUS | Status: AC
  Filled 2015-11-21: qty 10

## 2015-11-21 MED ORDER — FENTANYL CITRATE (PF) 100 MCG/2ML IJ SOLN
INTRAMUSCULAR | Status: AC
  Filled 2015-11-21: qty 2

## 2015-11-21 MED ORDER — SODIUM CHLORIDE 0.9 % IJ SOLN
INTRAMUSCULAR | Status: AC
  Filled 2015-11-21: qty 10

## 2015-11-21 MED ORDER — ACETAMINOPHEN 325 MG PO TABS: 650.0000 mg | ORAL_TABLET | Freq: Four times a day (QID) | ORAL | Status: DC | PRN

## 2015-11-21 MED ORDER — ONDANSETRON HCL 4 MG/2ML IJ SOLN
INTRAMUSCULAR | Status: AC
  Filled 2015-11-21: qty 2

## 2015-11-21 MED ORDER — LACTATED RINGERS IV SOLN
INTRAVENOUS | Status: DC | PRN
  Administered 2015-11-21: 07:00:00 via INTRAVENOUS

## 2015-11-21 MED ORDER — GABAPENTIN 300 MG PO CAPS
ORAL_CAPSULE | ORAL | Status: AC
  Filled 2015-11-21: qty 1

## 2015-11-21 MED ORDER — SIMETHICONE 80 MG PO CHEW: 40.0000 mg | CHEWABLE_TABLET | Freq: Four times a day (QID) | ORAL | Status: DC | PRN

## 2015-11-21 MED ORDER — FENTANYL CITRATE (PF) 100 MCG/2ML IJ SOLN
INTRAMUSCULAR | Status: DC | PRN
  Administered 2015-11-21 (×2): 50 ug via INTRAVENOUS

## 2015-11-21 MED ORDER — FENTANYL CITRATE (PF) 100 MCG/2ML IJ SOLN
25.0000 ug | INTRAMUSCULAR | Status: DC | PRN
  Administered 2015-11-21: 25 ug via INTRAVENOUS

## 2015-11-21 MED ORDER — ONDANSETRON 4 MG PO TBDP: 4.0000 mg | ORAL_TABLET | Freq: Four times a day (QID) | ORAL | Status: DC | PRN

## 2015-11-21 MED ORDER — 0.9 % SODIUM CHLORIDE (POUR BTL) OPTIME
TOPICAL | Status: DC | PRN
  Administered 2015-11-21: 1000 mL

## 2015-11-21 MED ORDER — PROPOFOL 10 MG/ML IV BOLUS
INTRAVENOUS | Status: AC
  Filled 2015-11-21: qty 20

## 2015-11-21 MED ORDER — LIDOCAINE 2% (20 MG/ML) 5 ML SYRINGE
INTRAMUSCULAR | Status: DC | PRN
  Administered 2015-11-21: 100 mg via INTRAVENOUS

## 2015-11-21 MED ORDER — SODIUM CHLORIDE 0.9 % IV SOLN
INTRAVENOUS | Status: DC
  Administered 2015-11-21: 10:00:00 via INTRAVENOUS

## 2015-11-21 MED ORDER — ACETAMINOPHEN 650 MG RE SUPP: 650.0000 mg | Freq: Four times a day (QID) | RECTAL | Status: DC | PRN

## 2015-11-21 MED ORDER — ONDANSETRON HCL 4 MG/2ML IJ SOLN
INTRAMUSCULAR | Status: DC | PRN
  Administered 2015-11-21: 4 mg via INTRAVENOUS

## 2015-11-21 MED ORDER — MIDAZOLAM HCL 5 MG/5ML IJ SOLN
INTRAMUSCULAR | Status: DC | PRN
  Administered 2015-11-21: 2 mg via INTRAVENOUS

## 2015-11-21 MED ORDER — DEXAMETHASONE SODIUM PHOSPHATE 10 MG/ML IJ SOLN
INTRAMUSCULAR | Status: DC | PRN
  Administered 2015-11-21: 4 mg via INTRAVENOUS

## 2015-11-21 MED ORDER — MORPHINE SULFATE (PF) 2 MG/ML IV SOLN: 1.0000 mg | INTRAVENOUS | Status: DC | PRN

## 2015-11-21 MED ORDER — ACETAMINOPHEN 500 MG PO TABS
ORAL_TABLET | ORAL | Status: AC
  Filled 2015-11-21: qty 2

## 2015-11-21 MED ORDER — METHOCARBAMOL 500 MG PO TABS
500.0000 mg | ORAL_TABLET | Freq: Four times a day (QID) | ORAL | Status: DC | PRN
  Filled 2015-11-21: qty 1

## 2015-11-21 MED ORDER — LIDOCAINE 2% (20 MG/ML) 5 ML SYRINGE
INTRAMUSCULAR | Status: AC
  Filled 2015-11-21: qty 5

## 2015-11-21 SURGICAL SUPPLY — 56 items
APPLIER CLIP 9.375 MED OPEN (MISCELLANEOUS) ×3
BINDER BREAST LRG (GAUZE/BANDAGES/DRESSINGS) ×3 IMPLANT
BINDER BREAST MEDIUM (GAUZE/BANDAGES/DRESSINGS) ×3 IMPLANT
BINDER BREAST XLRG (GAUZE/BANDAGES/DRESSINGS) IMPLANT
CANISTER SUCTION 2500CC (MISCELLANEOUS) ×3 IMPLANT
CHLORAPREP W/TINT 26ML (MISCELLANEOUS) ×3 IMPLANT
CLIP APPLIE 9.375 MED OPEN (MISCELLANEOUS) ×1 IMPLANT
CLOSURE WOUND 1/2 X4 (GAUZE/BANDAGES/DRESSINGS) ×3
CONT SPEC 4OZ CLIKSEAL STRL BL (MISCELLANEOUS) ×3 IMPLANT
COVER PROBE W GEL 5X96 (DRAPES) ×3 IMPLANT
COVER SURGICAL LIGHT HANDLE (MISCELLANEOUS) IMPLANT
DERMABOND ADVANCED (GAUZE/BANDAGES/DRESSINGS) ×2
DERMABOND ADVANCED .7 DNX12 (GAUZE/BANDAGES/DRESSINGS) ×1 IMPLANT
DRAIN CHANNEL 19F RND (DRAIN) ×3 IMPLANT
DRAPE LAPAROSCOPIC ABDOMINAL (DRAPES) ×3 IMPLANT
ELECT BLADE 4.0 EZ CLEAN MEGAD (MISCELLANEOUS) ×3
ELECT CAUTERY BLADE 6.4 (BLADE) ×3 IMPLANT
ELECT REM PT RETURN 9FT ADLT (ELECTROSURGICAL) ×3
ELECTRODE BLDE 4.0 EZ CLN MEGD (MISCELLANEOUS) ×1 IMPLANT
ELECTRODE REM PT RTRN 9FT ADLT (ELECTROSURGICAL) ×1 IMPLANT
EVACUATOR SILICONE 100CC (DRAIN) ×3 IMPLANT
GAUZE SPONGE 4X4 12PLY STRL (GAUZE/BANDAGES/DRESSINGS) ×3 IMPLANT
GLOVE BIO SURGEON STRL SZ7 (GLOVE) ×6 IMPLANT
GLOVE BIOGEL PI IND STRL 6.5 (GLOVE) ×2 IMPLANT
GLOVE BIOGEL PI IND STRL 7.5 (GLOVE) ×1 IMPLANT
GLOVE BIOGEL PI INDICATOR 6.5 (GLOVE) ×4
GLOVE BIOGEL PI INDICATOR 7.5 (GLOVE) ×2
GLOVE SURG SS PI 6.5 STRL IVOR (GLOVE) ×9 IMPLANT
GOWN STRL REUS W/ TWL LRG LVL3 (GOWN DISPOSABLE) ×3 IMPLANT
GOWN STRL REUS W/TWL LRG LVL3 (GOWN DISPOSABLE) ×6
HEMOSTAT ARISTA ABSORB 3G PWDR (MISCELLANEOUS) ×3 IMPLANT
KIT BASIN OR (CUSTOM PROCEDURE TRAY) ×3 IMPLANT
KIT ROOM TURNOVER OR (KITS) ×3 IMPLANT
MARKER SKIN DUAL TIP RULER LAB (MISCELLANEOUS) ×3 IMPLANT
NEEDLE 18GX1X1/2 (RX/OR ONLY) (NEEDLE) IMPLANT
NEEDLE FILTER BLUNT 18X 1/2SAF (NEEDLE)
NEEDLE FILTER BLUNT 18X1 1/2 (NEEDLE) IMPLANT
NEEDLE HYPO 25GX1X1/2 BEV (NEEDLE) IMPLANT
NS IRRIG 1000ML POUR BTL (IV SOLUTION) ×3 IMPLANT
PACK GENERAL/GYN (CUSTOM PROCEDURE TRAY) ×3 IMPLANT
PAD ABD 8X10 STRL (GAUZE/BANDAGES/DRESSINGS) ×3 IMPLANT
PAD ARMBOARD 7.5X6 YLW CONV (MISCELLANEOUS) ×9 IMPLANT
PIN SAFETY STERILE (MISCELLANEOUS) IMPLANT
SPECIMEN JAR X LARGE (MISCELLANEOUS) ×3 IMPLANT
STAPLER VISISTAT 35W (STAPLE) IMPLANT
STRIP CLOSURE SKIN 1/2X4 (GAUZE/BANDAGES/DRESSINGS) ×6 IMPLANT
SUT ETHILON 2 0 FS 18 (SUTURE) ×6 IMPLANT
SUT MON AB 4-0 PC3 18 (SUTURE) ×3 IMPLANT
SUT SILK 2 0 SH (SUTURE) IMPLANT
SUT VIC AB 3-0 54X BRD REEL (SUTURE) ×1 IMPLANT
SUT VIC AB 3-0 BRD 54 (SUTURE) ×2
SUT VIC AB 3-0 SH 18 (SUTURE) IMPLANT
SUT VIC AB 3-0 SH 8-18 (SUTURE) ×6 IMPLANT
SYR CONTROL 10ML LL (SYRINGE) IMPLANT
TOWEL OR 17X24 6PK STRL BLUE (TOWEL DISPOSABLE) ×3 IMPLANT
TOWEL OR 17X26 10 PK STRL BLUE (TOWEL DISPOSABLE) ×3 IMPLANT

## 2015-11-21 NOTE — H&P (Signed)
50 yof referred by Dr Truitt Merle. she has newly diagnosed right breast cancer. she has right breast mass noted last year. this has increased in size. she has no prior breast history. she has no family history of breast or ovarian cancer. she has no nipple dc. mm last on left dec 16. she underwent dx mm on right that showed a 1.6 cm lobulated mass in liq with possible cw and skin involvement. an [place] of right axilla is negative. 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 oncotype on this specimen is 19. she then underwent mr due to question of cw involvement that shows nl left breast, nl ax nodes and the right breast has a 1.7x1.7x1 cm mass with no definite cw involvement. lateral in the right breast there is a 2x1.4x3.6 cm area of nme. this underwent core biopsy and is adh. she is here with her husband to discuss options. due to delay from trip to [place] she has been on tamoxifen.  Other Problems Elbert Ewings, CMA; 11/04/2015 10:24 AM) Breast Cancer Lump In Breast  Past Surgical History Elbert Ewings, [place]; 11/04/2015 10:24 AM) No pertinent past surgical history  Diagnostic Studies History Elbert Ewings, CMA; 11/04/2015 10:24 AM) Colonoscopy never Mammogram 1-3 years ago  Allergies Elbert Ewings, CMA; 11/04/2015 10:24 AM) No Known Drug Allergies10/20/2017  Medication History Elbert Ewings, CMA; 11/04/2015 10:24 AM) Tamoxifen Citrate (20MG Tablet, Oral) Active. Medications Reconciled  Social History Elbert Ewings, [place]; 11/04/2015 10:24 AM) No alcohol use No caffeine use No drug use Tobacco use Never smoker.  Family History Elbert Ewings, [place]; 11/04/2015 10:24 AM) Cerebrovascular Accident Family Members In General. Heart disease in female family member before age 41 Hypertension Father, Mother, Sister.  Pregnancy / Birth History Elbert Ewings, CMA; 11/04/2015 10:24 AM) Age at menarche 50 years. Age of menopause  [age] Gravida 2 Irregular periods Length (months) of breastfeeding 7-12 Maternal age 51-30  Review of Systems Elbert Ewings CMA; 11/04/2015 10:24 AM) General Not Present- Appetite Loss, Chills, Fatigue, Fever, Night Sweats, Weight Gain and Weight Loss. HEENT Present- Wears glasses/contact lenses. Not Present- Earache, Hearing Loss, Hoarseness, Nose Bleed, Oral Ulcers, Ringing in the Ears, Seasonal Allergies, Sinus Pain, Sore Throat, Visual Disturbances and Yellow Eyes. Respiratory Not Present- Bloody sputum, Chronic Cough, Difficulty Breathing, Snoring and Wheezing. Breast Present- Breast Mass and Skin Changes. Not Present- Breast Pain and Nipple Discharge. Cardiovascular Not Present- Chest Pain, Difficulty Breathing Lying Down, Leg Cramps, Palpitations, Rapid Heart Rate, Shortness of Breath and Swelling of Extremities. Gastrointestinal Not Present- Abdominal Pain, Bloating, Bloody Stool, Change in Bowel Habits, Chronic diarrhea, Constipation, Difficulty Swallowing, Excessive gas, Gets full quickly at meals, Hemorrhoids, Indigestion, Nausea, Rectal Pain and Vomiting. Hematology Present- Easy Bruising. Not Present- Blood Thinners, Excessive bleeding, Gland problems, HIV and Persistent Infections.  Vitals Elbert Ewings CMA; 11/04/2015 10:24 AM) 11/04/2015 10:24 AM Weight: 104 lb Height: 64in Body Surface Area: 1.48 m Body Mass Index: 17.85 kg/m  Temp.: 97.49F(Temporal)  Pulse: 70 (Regular)  BP: 120/68 (Sitting, Left Arm, Standard)  Physical Exam Rolm Bookbinder MD; 11/04/2015 8:06 PM) General Mental Status-Alert. Orientation-Oriented X3. Chest and Lung Exam Chest and lung exam reveals -on auscultation, normal breath sounds, no adventitious sounds and normal vocal resonance. Breast Nipples-No Discharge. Note: 1.5 cm lower pole of breast mass partially fixed, no obvious skin involvement Cardiovascular Cardiovacular examination reveals -normal heart sounds,  regular rate and rhythm with no murmurs. Lymphatic Head & Neck General  Head & Neck Lymphatics: Bilateral - Description - Normal. Axillary General Axillary Region: Bilateral - Description - Normal. Note: no [place] adenopathy   Assessment & Plan Rolm Bookbinder MD; 11/04/2015 8:09 PM) BREAST CANCER OF LOWER-INNER QUADRANT OF RIGHT FEMALE BREAST (C50.311) Story: Right total mastectomy, right ax sn biopsy We discussed the staging and pathophysiology of breast cancer. We discussed all of the different options for treatment for breast cancer including surgery, chemotherapy, radiation therapy, Herceptin, and antiestrogen therapy. We discussed a sentinel lymph node biopsy as she does not appear to having lymph node involvement right now. We discussed the performance of that with injection of radioactive tracer. We discussed that there is a chance of having a positive node with a sentinel lymph node biopsy and we will await the permanent pathology to make any other first further decisions in terms of her treatment. One of these options might be to return to the operating room to perform an axillary lymph node dissection. We discussed up to a 5% risk lifetime of chronic shoulder pain as well as lymphedema associated with a sentinel lymph node biopsy. the area of adh needs to be excised as well as tumor. due to breast size and volume of tissue that will need to be excised I think mastectomy is only option. she does not want to consider reconstruction or seeing a Psychiatric nurse. we discussed mastectomy, recovery, incision, drains, as well as other therapy based on pathology. will proceed soon for surgery

## 2015-11-21 NOTE — Anesthesia Procedure Notes (Signed)
Procedure Name: LMA Insertion Date/Time: 11/21/2015 7:51 AM Performed by: Melina Copa, Citlaly Camplin R Pre-anesthesia Checklist: Patient identified, Emergency Drugs available, Suction available and Patient being monitored Patient Re-evaluated:Patient Re-evaluated prior to inductionOxygen Delivery Method: Circle System Utilized Preoxygenation: Pre-oxygenation with 100% oxygen Intubation Type: IV induction Ventilation: Mask ventilation without difficulty LMA: LMA inserted LMA Size: 3.0 Number of attempts: 1 Airway Equipment and Method: Bite block Placement Confirmation: positive ETCO2 Tube secured with: Tape Dental Injury: Teeth and Oropharynx as per pre-operative assessment

## 2015-11-21 NOTE — Anesthesia Postprocedure Evaluation (Signed)
Anesthesia Post Note  Patient: Amelianna Mitrani  Procedure(s) Performed: Procedure(s) (LRB): RIGHT TOTAL MASTECTOMY WITH RIGHT AXILLARY SENTINEL LYMPH NODE BIOPSY (Right)  Patient location during evaluation: PACU Anesthesia Type: General Level of consciousness: sedated Pain management: satisfactory to patient Vital Signs Assessment: post-procedure vital signs reviewed and stable Respiratory status: spontaneous breathing Cardiovascular status: stable Anesthetic complications: no    Last Vitals:  Vitals:   11/21/15 0945 11/21/15 0952  BP:  122/76  Pulse: 72 69  Resp: 10 12  Temp:  36.7 C    Last Pain:  Vitals:   11/21/15 0955  TempSrc:   PainSc: [facility]

## 2015-11-21 NOTE — Discharge Instructions (Signed)
[facility] °[phone] ° °MASTECTOMY: POST OP INSTRUCTIONS ° °Always review your discharge instruction sheet given to you by the facility where your surgery was performed. °IF YOU HAVE DISABILITY OR FAMILY LEAVE FORMS, YOU MUST BRING THEM TO THE OFFICE FOR PROCESSING.   °DO NOT GIVE THEM TO YOUR DOCTOR. °A prescription for pain medication may be given to you upon discharge.  Take your pain medication as prescribed, if needed.  If narcotic pain medicine is not needed, then you may take acetaminophen (Tylenol), naprosyn (Alleve) or ibuprofen (Advil) as needed. °1. Take your usually prescribed medications unless otherwise directed. °2. If you need a refill on your pain medication, please contact your pharmacy.  They will contact our office to request authorization.  Prescriptions will not be filled after 5pm or on week-ends. °3. You should follow a light diet the first few days after arrival home, such as soup and crackers, etc.  Resume your normal diet the day after surgery. °4. Most patients will experience some swelling and bruising on the chest and underarm.  Ice packs will help.  Swelling and bruising can take several days to resolve. Wear the binder day and night until you return to the office.  °5. It is common to experience some constipation if taking pain medication after surgery.  Increasing fluid intake and taking a stool softener (such as Colace) will usually help or prevent this problem from occurring.  A mild laxative (Milk of Magnesia or Miralax) should be taken according to package instructions if there are no bowel movements after 48 hours. °6. Unless discharge instructions indicate otherwise, leave your bandage dry and in place until your next appointment in 3-5 days.  You may take a limited sponge bath.  No tube baths or showers until the drains are removed.  You may have steri-strips (small skin tapes) in place directly over the incision.  These strips should be left on the  skin for 7-10 days. If you have glue it will come off in next couple week.  Any sutures will be removed at an office visit °7. DRAINS:  If you have drains in place, it is important to keep a list of the amount of drainage produced each day in your drains.  Before leaving the hospital, you should be instructed on drain care.  Call our office if you have any questions about your drains. I will remove your drains when they put out less than 30 cc or ml for 2 consecutive days. °8. ACTIVITIES:  You may resume regular (light) daily activities beginning the next day--such as daily self-care, walking, climbing stairs--gradually increasing activities as tolerated.  You may have sexual intercourse when it is comfortable.  Refrain from any heavy lifting or straining until approved by your doctor. °a. You may drive when you are no longer taking prescription pain medication, you can comfortably wear a seatbelt, and you can safely maneuver your car and apply brakes. °b. RETURN TO WORK:  __________________________________________________________ °9. You should see your doctor in the office for a follow-up appointment approximately 3-5 days after your surgery.  Your doctor’s nurse will typically make your follow-up appointment when she calls you with your pathology report.  Expect your pathology report 3-4business days after surgery. °10. OTHER INSTRUCTIONS: ______________________________________________________________________________________________ ____________________________________________________________________________________________ °WHEN TO CALL YOUR DR Mardi Cannady: °1. Fever over 101.0 °2. Nausea and/or vomiting °3. Extreme swelling or bruising °4. Continued bleeding from incision. °5. Increased pain, redness, or drainage from the incision. °The clinic staff is available   to answer your questions during regular business hours.  Please don’t hesitate to call and ask to speak to one of the nurses for clinical concerns.  If  you have a medical emergency, go to the nearest emergency room or call 911.  A surgeon from [facility] is always on call at the hospital. °[address], [address], [place], [place]  [address] ? [address] [address], [place], [place]   [address] °[phone] ? [phone] ? FAX [phone] °Web site: [email or web address] ° °

## 2015-11-21 NOTE — Transfer of Care (Signed)
Immediate Anesthesia Transfer of Care Note  Patient: Shelly Smith  Procedure(s) Performed: Procedure(s): RIGHT TOTAL MASTECTOMY WITH RIGHT AXILLARY SENTINEL LYMPH NODE BIOPSY (Right)  Patient Location: PACU  Anesthesia Type:GA combined with regional for post-op pain  Level of Consciousness: sedated  Airway & Oxygen Therapy: Patient Spontanous Breathing and Patient connected to nasal cannula oxygen  Post-op Assessment: Report given to RN, Post -op Vital signs reviewed and stable and Patient moving all extremities  Post vital signs: Reviewed and stable  Last Vitals:  Vitals:   11/21/15 0849 11/21/15 0852  BP:  108/61  Pulse:  77  Resp:  14  Temp: 36.4 C     Last Pain:  Vitals:   11/21/15 0611  TempSrc: Oral         Complications: No apparent anesthesia complications

## 2015-11-21 NOTE — Interval H&P Note (Signed)
History and Physical Interval Note:  11/21/2015 7:15 AM  Shelly Smith  has presented today for surgery, with the diagnosis of RIGHT BREAST CANCER  The various methods of treatment have been discussed with the patient and family. After consideration of risks, benefits and other options for treatment, the patient has consented to  Procedure(s): RIGHT TOTAL MASTECTOMY WITH RIGHT AXILLARY SENTINEL LYMPH NODE BIOPSY (N/A) as a surgical intervention .  The patient's history has been reviewed, patient examined, no change in status, stable for surgery.  I have reviewed the patient's chart and labs.  Questions were answered to the patient's satisfaction.     Sarim Rothman

## 2015-11-21 NOTE — Anesthesia Procedure Notes (Addendum)
Anesthesia Regional Block:  Pectoralis block  Pre-Anesthetic Checklist: ,, timeout performed, Correct Patient, Correct Site, Correct Laterality, Correct Procedure, Correct Position, site marked, Risks and benefits discussed, at surgeon's request and post-op pain management  Laterality: Right  Prep: chloraprep       Needles:  Injection technique: Single-shot  Needle Type: Echogenic Needle          Additional Needles:  Procedures: ultrasound guided (picture in chart) Pectoralis block Narrative:  Start time: 11/21/2015 7:03 AM End time: 11/21/2015 7:11 AM Injection made incrementally with aspirations every 5 mL. Anesthesiologist: Lyndle Herrlich  Additional Notes: 20cc .5% Marcaine

## 2015-11-21 NOTE — Op Note (Signed)
Preoperative diagnosis: clinical stage II right breast cancer Postoperative diagnosis: same as above Procedure: Right total mastectomy, right axillary sentinel node biopsy Surgeon: Dr Serita Grammes Anesthesia: general with pectoral block EBL: minimal Drains 1 19 Fr blake drain Specimens: right mastectomy marked short superior long lateral,  right axillary sentinel nodes Complications none Sponge and needle count correct dispo case to recovery stable  Indications: This is a 84 yof yof who presents with right breast cancer with clinically and radiologically negative nodes. We discussed options and have elected to do right total mastectomy with right axillary sentinel node biopsy. She declined reconstruction.   Procedure: After informed consent was obtained the patient was taken to the OR. She was given antibiotics and scds were in place. She was administered technetium in the standard fashion. She had a pectoral block completed She was placed under general anesthesia without complication. She was prepped and draped in the standard sterile surgical fashion. A timeout was performed. I made a large elliptical incision  to remove the skin overlying the tumor. This was near the IM fold.  I then created flaps to the clavicle, parasternal area, below the IM fold and latissimus. The breast and pectoral fascia were then removed. I did remove a small area of muscle as the tumor was very close to it. This was marked as above and passed off the table.  I located several sentinel nodes and these were removed. The highest count was 2611. There was no more radioactivity.  There were no palpable nodes.I obtained hemostasis. I placed a 19 Fr Blake drain and secured these with 2-0 nylon. I then placed Arista in the axilla. I then closed the dermis with multiple 3-0 vicryls and this came together well.  The skin was closed with 4-0 monocryl and glue. steristrips were applied. She tolerated well and was  transferred to pacu stable.

## 2015-11-22 ENCOUNTER — Encounter (HOSPITAL_COMMUNITY): Payer: Self-pay | Admitting: General Surgery

## 2015-11-22 DIAGNOSIS — D0511 Intraductal carcinoma in situ of right breast: Secondary | ICD-10-CM | POA: Diagnosis not present

## 2015-11-22 MED ORDER — OXYCODONE HCL 5 MG PO TABS: 5.0000 mg | ORAL_TABLET | Freq: Four times a day (QID) | ORAL | 0 refills | Status: DC | PRN

## 2015-11-22 NOTE — Progress Notes (Signed)
Pt discharged to home.  Discharge instructions explained to pt.  Pt has no questions at the time of discharge.  Pt states she has all belongings.  IV removed.  Pt ambulated off unit on her own.   

## 2015-11-23 NOTE — Discharge Summary (Signed)
Physician Discharge Summary  Patient ID: Shelly Smith MRN: [id number] DOB/AGE: 1965/03/17 50 y.o.  Admit date: 11/21/2015 Discharge date: 11/23/2015  Admission Diagnoses: Right breast cancer  Discharge Diagnoses:  Active Problems:   Cancer of right female breast [facility])   Discharged Condition: good  Hospital Course: 30 yof underwent right total mastectomy and sn biopsy for right breast cancer. She remained overnight and is doing well. Tolerating diet, drains with expected output, pain well controlled, flaps viable with no hematoma. She will be discharged home with path pending.   Consults: None  Significant Diagnostic Studies: none  Treatments: surgery: right tm, right ax sn biopsy   Disposition: 01-Home or Self Care     Medication List    STOP taking these medications   tamoxifen 20 MG tablet Commonly known as:  NOLVADEX     TAKE these medications   multivitamin with minerals Tabs tablet Take 1 tablet by mouth daily.   oxyCODONE 5 MG immediate release tablet Commonly known as:  Oxy IR/ROXICODONE Take 1 tablet (5 mg total) by mouth every 6 (six) hours as needed for moderate pain, severe pain or breakthrough pain.      Follow-up Information    Chizaram Latino, MD Follow up in 1 week(s).   Specialty:  General Surgery Contact information: [address] [place] [address] [phone]           Signed: Rolm Bookbinder 11/23/2015, 7:59 AM

## 2015-12-02 ENCOUNTER — Ambulatory Visit: Payer: 59 | Attending: General Surgery | Admitting: Physical Therapy

## 2015-12-02 DIAGNOSIS — Z483 Aftercare following surgery for neoplasm: Secondary | ICD-10-CM | POA: Insufficient documentation

## 2015-12-02 DIAGNOSIS — M25611 Stiffness of right shoulder, not elsewhere classified: Secondary | ICD-10-CM | POA: Diagnosis present

## 2015-12-02 DIAGNOSIS — R222 Localized swelling, mass and lump, trunk: Secondary | ICD-10-CM | POA: Diagnosis not present

## 2015-12-02 NOTE — Therapy (Signed)
[place], [place], [address] Phone: [phone]   Fax:  [phone]  Physical Therapy Evaluation  Patient Details  Name: Shelly Smith MRN: [id number] Date of Birth: 1965-05-02 Referring Provider: Dr. Donne Hazel   Encounter Date: 12/02/2015      PT End of Session - 12/02/15 1240    Visit Number 1   Number of Visits 9   Date for PT Re-Evaluation 01/06/16   PT Start Time 0845   PT Stop Time 0935   PT Time Calculation (min) 50 min   Activity Tolerance Patient tolerated treatment well   Behavior During Therapy [facility] for tasks assessed/performed      Past Medical History:  Diagnosis Date  . Breast cancer of lower-inner quadrant of right female breast ([place]) 10/10/2015  . History of bronchitis     Past Surgical History:  Procedure Laterality Date  . MASTECTOMY W/ SENTINEL NODE BIOPSY Right 11/21/2015   Procedure: RIGHT TOTAL MASTECTOMY WITH RIGHT AXILLARY SENTINEL LYMPH NODE BIOPSY;  Surgeon: Rolm Bookbinder, MD;  Location: [place];  Service: General;  Laterality: Right;  . NO PAST SURGERIES      There were no vitals filed for this visit.       Subjective Assessment - 12/02/15 0858    Subjective Feels like she is doing Ok. She still has some tightness in her left axilla and into chest like some "strings pulling"    Pertinent History Right total mastectomy with one sentinel node biopsy 11//06/2015 She will have radiation.  She will not have to had chemotherapy.     Currently in Pain? No/denies            [facility] PT Assessment - 12/02/15 0001      Assessment   Medical Diagnosis right breast cancer    Referring Provider Dr. Donne Hazel    Onset Date/Surgical Date 11/21/15   Hand Dominance Right     Precautions   Precautions Other (comment)     Restrictions   Weight Bearing Restrictions No     Balance Screen   Has the patient fallen in the past 6 months No   Has the patient had a decrease in  activity level because of a fear of falling?  No   Is the patient reluctant to leave their home because of a fear of falling?  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  She will consider using a strap to hold it up into axilla more to provide some compression to full area that she has above where the binder currently sits on her chest    Skin Integrity no open areas    Quick DASH  11.36   pt did  not answer all questions      Sensation   Light Touch Not tested     Coordination   Gross Motor Movements are Fluid and Coordinated Yes     Posture/Postural Control   Posture/Postural Control No significant limitations     AROM   Right Shoulder Flexion 145 Degrees   Right Shoulder ABduction 128 Degrees  in scaption    Right Shoulder Internal Rotation 50 Degrees   Right Shoulder External Rotation 80 Degrees   Left Shoulder Flexion 150 Degrees   Left Shoulder ABduction 165 Degrees   Left Shoulder External Rotation 90 Degrees   Left Shoulder Horizontal ABduction 50 Degrees     Strength   Overall Strength Comments decreased scapular stabalizationon the right  with shoulder hiking visible with right arm elevation    Right Shoulder Flexion 3-/5   Right Shoulder ABduction  3-/5   Left Shoulder Flexion 4/5   Left Shoulder ABduction 4/5     Palpation   Palpation comment guitar string axillary cording in axilla down to medical upper arm.  Also palpable at antecubital fossa and wrist but not painful at this area.            LYMPHEDEMA/ONCOLOGY QUESTIONNAIRE - 12/02/15 [id number]      Type   Cancer Type right breast cancer      Surgeries   Mastectomy Date 11/21/15   Number Lymph Nodes Removed 1     Treatment   Active Chemotherapy Treatment No   Active Radiation Treatment No  will start in a few weeks      What other symptoms do you have   Are you Having Heaviness or Tightness No   Are you having Pain Yes   Are you having pitting edema No   Is it Hard or Difficult finding clothes that fit No   Do you have infections No   Is there Decreased scar mobility --  did not test    Stemmer Sign No     Right Upper Extremity Lymphedema   10 cm Proximal to Olecranon Process 22 cm   Olecranon Process 20 cm   10 cm Proximal to Ulnar Styloid Process 21 cm   Just Proximal to Ulnar Styloid Process 12.6 cm   Across Hand at [employer] 17 cm   At [place] of 2nd Digit 5 cm   Other visible fullness at anterio and lateral right chest      Left Upper Extremity Lymphedema   10 cm Proximal to Olecranon Process 21.5 cm   Olecranon Process 20 cm   10 cm Proximal to Ulnar Styloid Process 20.5 cm   Just Proximal to Ulnar Styloid Process 12.7 cm   Across Hand at [employer] 17 cm   At [place] of 2nd Digit 5 cm           Quick Dash - 12/02/15 0001    Open a tight or new jar --  did not rate    Do heavy household chores (wash walls, wash floors) --  did not rate   Carry a shopping bag or briefcase No difficulty   Wash your back No difficulty   Use a knife to cut food No difficulty   Recreational activities in which you take some force or impact through your arm, shoulder, or hand (golf, hammering, tennis) Mild difficulty   During the past week, to what extent  has your arm, shoulder or hand problem interfered with your normal social activities with family,  friends, neighbors, or groups? Quite a bit   During the past week, to what extent has your arm, shoulder or hand problem limited your work or other regular daily activities Slightly   Arm, shoulder, or hand pain. Mild   Tingling (pins and needles) in your arm, shoulder, or hand Mild   Difficulty Sleeping No difficulty   DASH Score 11.36 %                     PT Education - 12/02/15 1239    Education provided Yes   Education Details where to get a compression sleeve and gauntlet, knitted knockers information,    Person(s) Educated Patient   Methods Explanation;Demonstration;Handout   Comprehension Verbalized understanding                Long Term Clinic Goals - 12/02/15 1258      CC Long Term Goal  #1   Title Patient with verbalize an understanding of lymphedema risk reduction precautions   Time 4   Period Weeks   Status New     CC Long Term Goal  #2   Title Patient will be independent in a home exercise program   Time 4   Period Weeks   Status New     CC Long Term Goal  #3   Title Patient will improve left shoulder abduction to 140 degrees so that she can achieve position needed for radiation therapy.   Baseline 128 on 12/02/2015   Time 4   Period Weeks   Status New     CC Long Term Goal  #4   Title Patient will report a decrease in "pulling" pain by 50% so they can perform daily activities with greater ease   Time 4   Period Weeks   Status New            Plan - 12/02/15 1251    Clinical Impression Statement 50 yo female not quite 2 weeks post right mastectomy with one lymph node removed who is having some fullness at anterior and lateral chest above compression binder and "pulling" in right axilla with palpable axillary cording. She has decreased shoulder range of motion and needs to prepare for radiation theapy.  This is low complexity eval due  to lack of comorbidities    Rehab Potential Good   Clinical Impairments Affecting Rehab Potential none at this time   PT Frequency 2x / week   PT Duration 4 weeks   PT Treatment/Interventions ADLs/Self Care Home Management;Patient/family education;DME Instruction;Manual techniques;Therapeutic activities;Manual lymph drainage;Therapeutic exercise;Compression bandaging;Passive range of motion   PT Next Visit Plan Manual techniques for axilllary cording and post op edema. Progress HEP, Instruct about ABC class and sign pt up if interested     Consulted and Agree with Plan of Care Patient      Patient will benefit from skilled therapeutic intervention in order to improve the following deficits and impairments:  Decreased skin integrity, Increased edema, Decreased knowledge of precautions, Decreased knowledge of use of DME, Decreased strength, Increased fascial restricitons, Decreased range of motion  Visit Diagnosis: Localized swelling, mass and lump, trunk - Plan: PT plan of care cert/re-cert  Stiffness of right shoulder joint - Plan: PT plan of care cert/re-cert  Aftercare following surgery for neoplasm - Plan: PT plan of care cert/re-cert     Problem List Patient Active Problem List   Diagnosis Date Noted  . Cancer of right female breast ([place]) 11/21/2015  . Breast cancer  of lower-inner quadrant of right female breast ([place]) 10/10/2015   Donato Heinz. Owens Shark PT  Norwood Levo 12/02/2015, 1:02 PM  [place], [place], [address] Phone: [phone]   Fax:  [phone]  Name: Shelly Smith MRN: [id number] Date of Birth: May 30, 1965

## 2015-12-02 NOTE — Patient Instructions (Addendum)
Class one compression sleeve and gauntlet for airplane travel  Can purchase locally at [facility] on [address] [employer] on [place] or [employer] - Supine  Hold cane at thighs with both hands, extend arms straight over head. Hold ___ seconds. Repeat ___ times. Do ___ times per day.    Lie on back with right hand behind head, folded towel under right elbow and look to left side. This is the position you will have to be in for radiation, so try to hold it for several minutes.

## 2015-12-07 NOTE — Progress Notes (Addendum)
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 Breast, simple mastectomy, Right - INVASIVE DUCTAL CARCINOMA GRADE I/III, SPANNING 1.6 CM. - DUCTAL CARICNOMA IN SITU, INTERMEDIATE GRADE. - LYMPHOVASCULAR INVASION IS IDENTIFIED. - PERINEURAL INVASION IS IDENTIFIED. - INVASIVE CARCINOMA IS FOCALLY 0.1 CM TO THE ANTERIOR MARGIN. - SEE ONCOLOGY TABLE BELOW. 2. Lymph node, sentinel, biopsy, Right Axillary - METASTATIC CARCINOMA IN 1 OF 1 LYMPH NODE (1/1).  Receptor status: ER (90%) PR (95%) Her 2 (NEG) Ki-67 (10%)  Did patient present with symptoms or was this found on screening mammography?: She noticed a small right breast nodule below the nipple in the summer 2016, no nipple discharge, skin change, pain or other symptoms. She saw her gynecologist a few months later, and breast exam did confirm the breast nodule. Mammogram was ordered, but was not scheduled. She noticed some in skin retraction in the area in earlier this year. She finally scheduled for her mammogram in early September, which showed a 1.6 cm lobulated mass in the right breast lower inner quadrant.  Past/Anticipated interventions by surgeon, if any: 11/21/15 Procedure: Right total mastectomy, right axillary sentinel node biopsy Surgeon: Dr Serita Grammes  Past/Anticipated interventions by medical oncology, if any: Dr. Burr Medico 11/04/15 Plan -She will have right breast mastectomy and sentinel lymph node biopsy by Dr. Donne Hazel soon (done 11/21/15) -She will continue tamoxifen, and hold it a few days before and one week after surgery -I plan to see her back in 2-3  weeks after her breast surgery (She saw Dr. Burr Medico today) -No adjuvant chemotherapy based on her Oncotype results -We'll wait her final surgical path, to see if she need postmastectomy radiation    Lymphedema issues, if any:  She tells me several times a day she has some swelling in her Right upper arm, it will improve on it's own. She has good arm movement, and has been performing exercises recommended to her by PT  Pain issues, if any:  She denies  SAFETY ISSUES:  Prior radiation? No  Pacemaker/ICD? No  Possible current pregnancy? No, she is still having periods last one being 11/24/15. She uses foam for birth control.   Is the patient on methotrexate? No  Current Complaints / other details:   They have several questions regarding radiation and future cancer risk. She has questions regarding side effects of radiation treatment also.  BP 108/73   Pulse 77   Temp 97.8 F (36.6 C)   Wt 102 lb 6.4 oz (46.4 kg)   LMP 08/30/2015   SpO2 100% Comment: room air  BMI 17.58 kg/m    Wt Readings from Last 3 Encounters:  12/13/15 102 lb 6.4 oz (46.4 kg)  12/13/15 104 lb 9.6 oz (47.4 kg)  11/21/15 102 lb (46.3 kg)      Phenix Grein, Stephani Police, RN 12/07/2015,7:38 AM

## 2015-12-12 NOTE — Progress Notes (Signed)
[name]  [phone] [phone]  Clinic follow up Note   Patient Care Team: Azucena Fallen, MD as PCP - General (Obstetrics and Gynecology) Kyung Rudd, MD as Consulting Physician (Radiation Oncology) Truitt Merle, MD as Consulting Physician (Hematology) Erroll Luna, MD as Consulting Physician (General Surgery) 12/13/2015  CHIEF COMPLAINT:  Follow up right breast cancer  Oncology History   Breast cancer of lower-inner quadrant of right female breast [facility])   Staging form: Breast, AJCC 7th Edition   - Clinical stage from 10/04/2015: Stage IA (T1c, N0, M0) - Signed by Truitt Merle, MD on 10/12/2015   - Pathologic stage from 11/21/2015: Stage IIA (T1c, N1a, cM0) - Signed by Truitt Merle, MD on 12/12/2015      Breast cancer of lower-inner quadrant of right female breast ([place])   10/04/2015 Mammogram    Diagnostic mammogram and US showed a 1.6cm lobulated irregular high density mass in the lower inner quadrant right breast, possible chest wall invasion and skin involvement, right axillar [place] (-)      10/05/2015 Initial Diagnosis    Breast cancer of lower-inner quadrant of right female breast ([place])      10/05/2015 Initial Biopsy    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       10/05/2015 Receptors her2    ER 90% +, PR 95%+, strong staining, Her2-, Ki67 10%      10/05/2015 Oncotype testing    Oncotype recurrence score of 19, intermediate risk, predicts 10 year distant recurrence risk of 12% with tamoxifen.      10/13/2015 Imaging    Bilateral breast MRI with and without contrast showed the known 1.7 x 1.7 x 1.0 cm enhancing mass, and a 2.0 x 1.4 x 3.6 cm area of non-mass enhancement. Left breast was negative. Lymph nodes appear normal.      10/13/2015 -  Anti-estrogen oral therapy    Tamoxifen 20 mg once daily      11/02/2015 Pathology Results    MRI guided lateral right breast no masses enhancement biopsy showed atypical  ductal hyperplasia and PASH       11/21/2015 Surgery    Right breast mastectomy and SLN biopsy by Dr. Donne Hazel       11/21/2015 Pathology Results    Right breast mastectomy showed 1.6cm invasive ductal carcinoma, G1, (+) LVI, (+) perineural invasion, one SLN was positive, margins were negative        HISTORY OF PRESENTING ILLNESS:  Shelly Smith 50 y.o. female is here because of Her recently diagnosed right breast cancer. She is accompanied by her husband to my clinic today.  She noticed a small right breast nodule below the nipple in the summer 2016, no nipple discharge, skin change, pain or other symptoms. She saw her gynecologist a few months later, and breast exam did confirm the breast nodule. Mammogram was ordered, but was not scheduled. She noticed some in skin retraction in the area in earlier this year. She finally scheduled for her mammogram a few weeks ago, which showed a 1.6 cm lobulated mass in the right breast lower inner quadrant. She underwent ultrasound-guided core needle biopsy the next day, which showed invasive lobular carcinoma, ER/PR strongly positive, HER-2 negative. She was referred to [place] for further management.  She feels well overall. No pain, or other discomfort. She denies any weight loss, change of appetite or energy level lately. She is to have mammograms every other year, no family history of breast cancer  or personal history of breast disease or biopsy. She is married, lives with her husband, the relocated from [place] to [place] last spring. She has 2 adult daughters. She is oriented in from [place].   GYN HISTORY  Menarchal: 13 LMP: 08/2015 Contraceptive: no  HRT: no  G2P2: two daughters 58 and 70 yo   CURRENT THERAPY: Tamoxifen 20 mg once daily, started on 10/13/2015  INTERIM HISTORY:  Shelly Smith returns for follow-up. She Underwent right mastectomy and sentinel lymph node biopsy on 11/21/2015. She tolerated surgery very well, and went back to work in a week  after her surgery. She denies any significant pain at the incision site, no limited range of motion of her right shoulder, no lymphedema on her right arm, no other new complaints. She held tamoxifen before surgery, and restarted 2 weeks later, she has been tolerating very well.   MEDICAL HISTORY:  Past Medical History:  Diagnosis Date  . Breast cancer of lower-inner quadrant of right female breast ([place]) 10/10/2015  . History of bronchitis     SURGICAL HISTORY: Past Surgical History:  Procedure Laterality Date  . MASTECTOMY W/ SENTINEL NODE BIOPSY Right 11/21/2015   Procedure: RIGHT TOTAL MASTECTOMY WITH RIGHT AXILLARY SENTINEL LYMPH NODE BIOPSY;  Surgeon: Rolm Bookbinder, MD;  Location: [facility];  Service: General;  Laterality: Right;  . NO PAST SURGERIES      SOCIAL HISTORY: Social History   Social History  . Marital status: Married    Spouse name: N/A  . Number of children: N/A  . Years of education: N/A   Occupational History  . Not on file.   Social History Main Topics  . Smoking status: Never Smoker  . Smokeless tobacco: Never Used  . Alcohol use Yes     Comment: social drinker   . Drug use: No  . Sexual activity: Not on file   Other Topics Concern  . Not on file   Social History Narrative  . No narrative on file    FAMILY HISTORY: Family History  Problem Relation Age of Onset  . Hypertension Mother   . Hypertension Father   . Cancer Cousin     brain tumor     ALLERGIES:  is allergic to no known allergies.  MEDICATIONS:  Current Outpatient Prescriptions  Medication Sig Dispense Refill  . tamoxifen (NOLVADEX) 20 MG tablet Take 1 tablet by mouth daily.    . Multiple Vitamin (MULTIVITAMIN WITH MINERALS) TABS tablet Take 1 tablet by mouth daily.    Marland Kitchen oxyCODONE (OXY IR/ROXICODONE) 5 MG immediate release tablet Take 1 tablet (5 mg total) by mouth every 6 (six) hours as needed for moderate pain, severe pain or breakthrough pain. (Patient not taking: Reported on  12/13/2015) 10 tablet 0   No current facility-administered medications for this visit.     REVIEW OF SYSTEMS:   Constitutional: Denies fevers, chills or abnormal night sweats Eyes: Denies blurriness of vision, double vision or watery eyes Ears, nose, mouth, throat, and face: Denies mucositis or sore throat Respiratory: Denies cough, dyspnea or wheezes Cardiovascular: Denies palpitation, chest discomfort or lower extremity swelling Gastrointestinal:  Denies nausea, heartburn or change in bowel habits Skin: Denies abnormal skin rashes Lymphatics: Denies new lymphadenopathy or easy bruising Neurological:Denies numbness, tingling or new weaknesses Behavioral/Psych: Mood is stable, no new changes  All other systems were reviewed with the patient and are negative.  PHYSICAL EXAMINATION: ECOG PERFORMANCE STATUS: 0 - Asymptomatic  Vitals:   12/13/15 0919  BP: 110/65  Pulse: 87  Resp: 18  Temp: 97.4 F (36.3 C)   Filed Weights   12/13/15 0919  Weight: 104 lb 9.6 oz (47.4 kg)    GENERAL:alert, no distress and comfortable SKIN: skin color, texture, turgor are normal, no rashes or significant lesions EYES: normal, conjunctiva are pink and non-injected, sclera clear OROPHARYNX:no exudate, no erythema and lips, buccal mucosa, and tongue normal  NECK: supple, thyroid normal size, non-tender, without nodularity LYMPH:  no palpable lymphadenopathy in the cervical, axillary or inguinal LUNGS: clear to auscultation and percussion with normal breathing effort HEART: regular rate & rhythm and no murmurs and no lower extremity edema ABDOMEN:abdomen soft, non-tender and normal bowel sounds Musculoskeletal:no cyanosis of digits and no clubbing  PSYCH: alert & oriented x 3 with fluent speech NEURO: no focal motor/sensory deficits Breasts: Breast inspection showed Right breast is surgically absent, incision is healing well, no discharge or skin erythema. Exam of left breast and bilateral axillas  showed no palpable mass or adenopathy.  LABORATORY DATA:  I have reviewed the data as listed CBC Latest Ref Rng & Units 12/13/2015 11/16/2015 10/12/2015  WBC 3.9 - 10.3 10e3/uL 3.6(L) 3.8(L) 5.9  Hemoglobin 11.6 - 15.9 g/dL 13.8 15.3(H) 14.1  Hematocrit 34.8 - 46.6 % 39.8 43.6 39.5  Platelets 145 - 400 10e3/uL 121(L) 128(L) 156   CMP Latest Ref Rng & Units 12/13/2015 11/16/2015 10/12/2015  Glucose 70 - 140 mg/dl 121 95 115  BUN 7.0 - 26.0 mg/dL 11.6 12 15.4  Creatinine 0.6 - 1.1 mg/dL 0.7 0.62 0.7  Sodium 136 - 145 mEq/L 140 139 139  Potassium 3.5 - 5.1 mEq/L 4.1 3.8 4.0  Chloride 101 - 111 mmol/L - 110 -  CO2 22 - 29 mEq/L _0 Calcium 8.4 - 10.4 mg/dL 8.8 9.2 9.1  Total Protein 6.4 - 8.3 g/dL 6.7 - 7.1  Total Bilirubin 0.20 - 1.20 mg/dL 0.26 - 0.65  Alkaline Phos 40 - 150 U/L 54 - 55  AST 5 - 34 U/L 23 - 19  ALT 0 - 55 U/L 25 - 18   PATHOLOGY REPORT  Diagnosis 11/02/2015 Breast, right, needle core biopsy, lateral - 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 - SEE COMMENT. Microscopic Comment There is a small focus of atypical ductal hyperplasia, supported by a positive stain for E-Cadherin and a negative stain for cytokeratin 5/6. The results were called to the [facility] on 11/03/2015  Diagnosis 10/05/2015 Breast, right, needle core biopsy INVASIVE DUCTAL CARCINOMA, GRADE 2 Results: IMMUNOHISTOCHEMICAL AND MORPHOMETRIC ANALYSIS PERFORMED MANUALLY Estrogen Receptor: 90%, POSITIVE, STRONG STAINING INTENSITY Progesterone Receptor: 95%, POSITIVE, STRONG STAINING INTENSITY Proliferation Marker Ki67: 10% Results: HER2 - NEGATIVE RATIO OF HER2/CEP17 SIGNALS 1.42 AVERAGE HER2 COPY NUMBER PER CELL 2.20  ONCOTYPE DX RS 19, predicts 10 year distance recurrence risk of 12% with tamoxifen   Diagnosis 11/21/2015 1. Breast, simple mastectomy, Right - INVASIVE DUCTAL CARCINOMA GRADE I/III, SPANNING 1.6  CM. - DUCTAL CARICNOMA IN SITU, INTERMEDIATE GRADE. - LYMPHOVASCULAR INVASION IS IDENTIFIED. - PERINEURAL INVASION IS IDENTIFIED. - INVASIVE CARCINOMA IS FOCALLY 0.1 CM TO THE ANTERIOR MARGIN. - SEE ONCOLOGY TABLE BELOW. 2. Lymph node, sentinel, biopsy, Right Axillary - METASTATIC CARCINOMA IN 1 OF 1 LYMPH NODE (1/1). Microscopic Comment 1. BREAST, INVASIVE TUMOR, WITH LYMPH NODES PRESENT Specimen, including laterality and lymph node sampling (sentinel, non-sentinel): Right breast and right axillary lymph node. Procedure: Simple mastectomy and one lymph node resection. Histologic type: Ductal Grade: I  Tubule formation: 2 Nuclear pleomorphism: 2 Mitotic:1 Tumor size (gross measurement): 1.6 cm Margins: Invasive, distance to closest margin: Focally 0.1 cm to the anterior margin. Lymphovascular invasion: Present. Ductal carcinoma in situ: Present. Grade: Intermediate. Extensive intraductal component: Not identified. Lobular neoplasia: Not identified. Tumor focality: Unifocal Extent of tumor: Confined to breast parenchyma. Lymph nodes: 1 of 3 FINAL for ANAALICIA, REIMANN ([id number]) Microscopic Comment(continued) Examined: 1 Sentinel 0 Non-sentinel 1 Total Lymph nodes with metastasis: 1 Isolated tumor cells (< 0.2 mm): 0 Micrometastasis: (> 0.2 mm and < 2.0 mm): 0 Macrometastasis: (> 2.0 mm): 1 Extracapsular extension: Not identified. Breast prognostic profile: Case [id number] Estrogen receptor: 90%, strong Progesterone receptor: 95%, strong. Her 2 neu: No amplification was detected. The ratio was 1.42. Ki-67: 10% Non-neoplastic breast: Fibrocystic changes, pseudoangiomatous stromal hyperplasia (PASH), and healing biopsy sites. TNM: pT1c, pN1a Comments: There are two lesions containing biopsy clips. The 1.6 cm lesion in the mid/inferior lateral specimen reveals grade I invasive ductal carcinoma. This tumor is focally 0.1 cm to the anterior margin. In addition, there is a 0.8  cm vaguely nodular lesion present lateral to this larger mass, containing a dumbbell shaped clip. Histologic evaluation of this area reveals benign fibrocystic changes and pseudoangiomatous stromal hyperplasia with a healing biopsy site. No carcinoma is identified here. (JBK:gt, 11/22/15)   RADIOGRAPHIC STUDIES: I have personally reviewed the radiological images as listed and agreed with the findings in the report.  No results found.  ASSESSMENT & PLAN: 50 year old premenopausal [place] woman, presented with a palpable right breast mass for one year.  1. Breast cancer of the lower inner quadrant of right female breast, pT1cN1aM0, stage IIA, ER/PR strongly positive, HER-2 negative, grade 2, Oncotype RS 19 --We discussed her surgical pathology findings with patient in details. -She had a complete surgical resection with negative margins, unfortunately the 1 sentinel lymph nodes was positive.  -the Oncotype Dx was done on her initial biopsy before suregery, the result was reviewed with her in details. She has intermedia risk based on the recurrence score, which predicts 10 year distant recurrence after 5 years of tamoxifen 12%. The benefit of chemotherapy in the intermedia risk group is small and controversial. Given her relatively low score in the intermedia group, I did not recommend adjuvant chemotherapy. She agrees with the plan -Her surgical path showed low grade tumor, low KI67, I do not think she needs mammaprint, which will likely be low risk disease if we did  -She started tamoxifen before surgery, has been tolerating well, without significant side effects. We'll continue for 10 years -She is probably perimenopausal, the advantage of ovarian suppression and aromatase inhibitor over tamoxifen is small, and I do not feel strongly she would benefit from OS+AI. Certainly if she became postmenopausal in the next 3 years, I would consider switching her tamoxifen to aromatase inhibitor. -She is  scheduled to see radiation oncologist Dr. Isidore Moos today for post mastectomy radiation, due to her positive lymph nodes. -We discussed breast cancer surveillance. She will continue screening left breast mammogram once a year, I encouraged her to do self exam, and a follow-up with [place] with lab every 3-4 months in the first 2-3 years, then every 6 months afterwards   Plan -Continue tamoxifen -She will likely start adjuvant radiation soon -Lab and follow-up in 3 months  All questions were answered. The patient knows to call the clinic with any problems, questions or concerns.  I spent 25 minutes counseling the patient face to face. The total time spent  in the appointment was 30 minutes and more than 50% was on counseling.  Truitt Merle  12/13/2015

## 2015-12-13 ENCOUNTER — Ambulatory Visit
Admission: RE | Admit: 2015-12-13 | Discharge: 2015-12-13 | Disposition: A | Discharge status: Home / Self Care | Payer: 59 | Source: Ambulatory Visit | Attending: Radiation Oncology | Admitting: Radiation Oncology

## 2015-12-13 ENCOUNTER — Other Ambulatory Visit (HOSPITAL_BASED_OUTPATIENT_CLINIC_OR_DEPARTMENT_OTHER): Payer: 59

## 2015-12-13 ENCOUNTER — Ambulatory Visit (HOSPITAL_BASED_OUTPATIENT_CLINIC_OR_DEPARTMENT_OTHER): Payer: 59 | Admitting: Hematology

## 2015-12-13 ENCOUNTER — Telehealth: Payer: Self-pay | Admitting: Hematology

## 2015-12-13 ENCOUNTER — Encounter: Payer: Self-pay | Admitting: Hematology

## 2015-12-13 ENCOUNTER — Encounter: Payer: Self-pay | Admitting: Radiation Oncology

## 2015-12-13 VITALS — BP 108/73 | HR 77 | Temp 97.8°F | Wt 102.4 lb

## 2015-12-13 VITALS — BP 110/65 | HR 87 | Temp 97.4°F | Resp 18 | Ht 64.0 in | Wt 104.6 lb

## 2015-12-13 DIAGNOSIS — C50511 Malignant neoplasm of lower-outer quadrant of right female breast: Secondary | ICD-10-CM | POA: Insufficient documentation

## 2015-12-13 DIAGNOSIS — Z510 Encounter for antineoplastic radiation therapy: Secondary | ICD-10-CM | POA: Diagnosis not present

## 2015-12-13 DIAGNOSIS — C50311 Malignant neoplasm of lower-inner quadrant of right female breast: Secondary | ICD-10-CM

## 2015-12-13 DIAGNOSIS — Z9011 Acquired absence of right breast and nipple: Secondary | ICD-10-CM | POA: Diagnosis not present

## 2015-12-13 DIAGNOSIS — Z79811 Long term (current) use of aromatase inhibitors: Secondary | ICD-10-CM | POA: Diagnosis not present

## 2015-12-13 DIAGNOSIS — Z170 Estrogen receptor positive status [ER+]: Secondary | ICD-10-CM | POA: Insufficient documentation

## 2015-12-13 LAB — CBC WITH DIFFERENTIAL/PLATELET
BASO%: 0 % (ref 0.0–2.0)
Basophils Absolute: 0 10*3/uL (ref 0.0–0.1)
EOS%: 1.7 % (ref 0.0–7.0)
Eosinophils Absolute: 0.1 10*3/uL (ref 0.0–0.5)
HEMATOCRIT: 39.8 % (ref 34.8–46.6)
HGB: 13.8 g/dL (ref 11.6–15.9)
LYMPH#: 1.5 10*3/uL (ref 0.9–3.3)
LYMPH%: 42 % (ref 14.0–49.7)
MCH: 33 pg (ref 25.1–34.0)
MCHC: 34.7 g/dL (ref 31.5–36.0)
MCV: 95.2 fL (ref 79.5–101.0)
MONO#: 0.2 10*3/uL (ref 0.1–0.9)
MONO%: 6.2 % (ref 0.0–14.0)
NEUT%: 50.1 % (ref 38.4–76.8)
NEUTROS ABS: 1.8 10*3/uL (ref 1.5–6.5)
PLATELETS: 121 10*3/uL — AB (ref 145–400)
RBC: 4.18 10*6/uL (ref 3.70–5.45)
RDW: 12.3 % (ref 11.2–14.5)
WBC: 3.6 10*3/uL — AB (ref 3.9–10.3)

## 2015-12-13 LAB — COMPREHENSIVE METABOLIC PANEL
ALT: 25 U/L (ref 0–55)
ANION GAP: 8 meq/L (ref 3–11)
AST: 23 U/L (ref 5–34)
Albumin: 3.5 g/dL (ref 3.5–5.0)
Alkaline Phosphatase: 54 U/L (ref 40–150)
BILIRUBIN TOTAL: 0.26 mg/dL (ref 0.20–1.20)
BUN: 11.6 mg/dL (ref 7.0–26.0)
CO2: 24 meq/L (ref 22–29)
CREATININE: 0.7 mg/dL (ref 0.6–1.1)
Calcium: 8.8 mg/dL (ref 8.4–10.4)
Chloride: 108 mEq/L (ref 98–109)
EGFR: >90 mL/min/{1.73_m2} (ref >90)
Glucose: 121 mg/dl (ref 70–140)
Potassium: 4.1 mEq/L (ref 3.5–5.1)
Sodium: 140 mEq/L (ref 136–145)
TOTAL PROTEIN: 6.7 g/dL (ref 6.4–8.3)

## 2015-12-13 NOTE — Telephone Encounter (Signed)
Gave patient avs report and appointments for February.  °

## 2015-12-13 NOTE — Progress Notes (Signed)
Radiation Oncology         [phone]) [phone] ________________________________  Initial outpatient Consultation  Name: Shelly Smith MRN: [id number]  Date: 12/13/2015  DOB: [birth year]  OI:NOMV,EHMCNOBS R, MD  Rolm Bookbinder, MD   REFERRING PHYSICIAN: Rolm Bookbinder, MD  DIAGNOSIS:    ICD-9-CM ICD-10-CM   1. Malignant neoplasm of lower-outer quadrant of right breast of female, estrogen receptor positive ([place]) 174.5 C50.511    V86.0 Z17.0    Stage IIA pT1c N1a cM0 Right Breast LOQ Invasive Ductal Carcinoma, ER+ / PR+ / Her2neg, Grade 1  CHIEF COMPLAINT: Here to discuss management of right breast cancer  HISTORY OF PRESENT ILLNESS::Shelly Smith is a 50 y.o. female who presented with a small right breast nodule below the nipple in the summer 2016.  She saw her gynecologist a few months later, and breast exam did confirm the breast nodule. Mammogram was ordered, but was not scheduled. She noticed some in skin retraction in the area in earlier this year. She finally scheduled for her mammogram in early September, which showed a 1.6 cm lobulated mass in the right breast lower inner quadrant. .    Biopsy showed on 10/05/15 Breast, right, needle core biopsy showed INVASIVE DUCTAL CARCINOMA, GRADE 2 Receptor Status: ER(90%), PR (95%), Her2-neu (NEG), Ki-(10%).  9-28 MRI breasts: Irregular enhancing mass within the lower outer right breast compatible with recently biopsied right breast carcinoma. There a few small spicules of this mass extending posteriorly toward the pectoralis muscle. No definitive pectoralis muscle enhancement to suggest invasion identified on current examination.  Within the lateral aspect of the right breast there is a 3.6 cm area of suspicious non mass enhancement.  Biopsy 11/02/15 Breast, right, needle core biopsy, lateral showed:  ATYPICAL DUCTAL HYPERPLASIA. - PSEUDOANGIOMATOUS STROMAL HYPERPLASIA (PASH) - FIBROCYSTIC CHANGES   On 11/21/15 Dr Donne Hazel performed  Breast, simple mastectomy, Right and SLN bx. This revealed: - INVASIVE DUCTAL CARCINOMA GRADE I/III, SPANNING 1.6 CM. - DUCTAL CARICNOMA IN SITU, INTERMEDIATE GRADE. - LYMPHOVASCULAR INVASION IS IDENTIFIED. - PERINEURAL INVASION IS IDENTIFIED. - INVASIVE CARCINOMA IS FOCALLY 0.1 CM TO THE ANTERIOR MARGIN.  2. Lymph node, sentinel, biopsy, Right Axillary - METASTATIC CARCINOMA IN 1 OF 1 LYMPH NODE (1/1).   She received tamoxifen before surgery. No adjuvant chemotherapy based on her Oncotype results.    Lymphedema issues, if any:  She tells me several times a day she has some swelling in her Right upper arm, it will improve on its own. She has good arm movement, and has been performing exercises recommended to her by PT   Pain issues, if any:  She denies   SAFETY ISSUES:  Prior radiation? No  Pacemaker/ICD? No  Possible current pregnancy? Denies; she is still having periods last one being 11/24/15. She uses foam for birth control.   Is the patient on methotrexate? No   Current Complaints / other details:   She has several questions regarding radiation and future cancer risk. She has questions regarding side effects of radiation treatment also.     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    PAST SURGICAL HISTORY: Past Surgical History:  Procedure Laterality Date  . MASTECTOMY W/ SENTINEL NODE BIOPSY Right 11/21/2015   Procedure: RIGHT TOTAL MASTECTOMY WITH RIGHT AXILLARY SENTINEL LYMPH NODE BIOPSY;  Surgeon: Rolm Bookbinder, MD;  Location: [facility];  Service: General;  Laterality:  Right;  . NO PAST SURGERIES      FAMILY HISTORY: family history includes Cancer in her cousin; Hypertension in her father and mother.  SOCIAL HISTORY:  reports that she has never smoked. She has never used smokeless tobacco. She reports that she drinks  alcohol. She reports that she does not use drugs.  ALLERGIES: No known allergies  MEDICATIONS:  Current Outpatient Prescriptions  Medication Sig Dispense Refill  . Multiple Vitamin (MULTIVITAMIN WITH MINERALS) TABS tablet Take 1 tablet by mouth daily.    . tamoxifen (NOLVADEX) 20 MG tablet Take 1 tablet by mouth daily.    Marland Kitchen oxyCODONE (OXY IR/ROXICODONE) 5 MG immediate release tablet Take 1 tablet (5 mg total) by mouth every 6 (six) hours as needed for moderate pain, severe pain or breakthrough pain. (Patient not taking: Reported on 12/13/2015) 10 tablet 0   No current facility-administered medications for this encounter.     REVIEW OF SYSTEMS: as above   PHYSICAL EXAM:  weight is 102 lb 6.4 oz (46.4 kg). Her temperature is 97.8 F (36.6 C). Her blood pressure is 108/73 and her pulse is 77. Her oxygen saturation is 100%.   General: Alert and oriented, in no acute distress HEENT: Head is normocephalic. Extraocular movements are intact. Oropharynx is clear. Neck: Neck is supple, no palpable cervical or supraclavicular lymphadenopathy. Heart: Regular in rate and rhythm with no murmurs, rubs, or gallops. Chest: Clear to auscultation bilaterally, with no rhonchi, wheezes, or rales. Abdomen: Soft, nontender, nondistended, with no rigidity or guarding. Extremities: No cyanosis or edema. Lymphatics: see Neck Exam Skin: No concerning lesions. Musculoskeletal: symmetric strength and muscle tone throughout. Neurologic: Cranial nerves II through XII are grossly intact. No obvious focalities. Speech is fluent. Coordination is intact. Psychiatric: Judgment and insight are intact. Affect is appropriate. Breasts: right chest wall, no active draining from scars - adhesive over chest wall scar, currently. No  palpable masses appreciated in the breasts or axillae  .  ECOG = 0  0 - Asymptomatic (Fully active, able to carry on all predisease activities without restriction)  1 - Symptomatic but  completely ambulatory (Restricted in physically strenuous activity but ambulatory and able to carry out work of a light or sedentary nature. For example, light housework, office work)  2 - Symptomatic, <50% in bed during the day (Ambulatory and capable of all self care but unable to carry out any work activities. Up and about more than 50% of waking hours)  3 - Symptomatic, >50% in bed, but not bedbound (Capable of only limited self-care, confined to bed or chair 50% or more of waking hours)  4 - Bedbound (Completely disabled. Cannot carry on any self-care. Totally confined to bed or chair)  5 - Death   Eustace Pen MM, Creech RH, Tormey DC, et al. [phone]). "Toxicity and response criteria of the [facility]". [place] Oncol. 5 (6): 649-55   LABORATORY DATA:  Lab Results  Component Value Date   WBC 3.6 (L) 12/13/2015   HGB 13.8 12/13/2015   HCT 39.8 12/13/2015   MCV 95.2 12/13/2015   PLT 121 (L) 12/13/2015   CMP     Component Value Date/Time   NA 140 12/13/2015 0907   K 4.1 12/13/2015 0907   CL 110 11/16/2015 0900   CO2 24 12/13/2015 0907   GLUCOSE 121 12/13/2015 0907   BUN 11.6 12/13/2015 0907   CREATININE 0.7 12/13/2015 0907   CALCIUM 8.8 12/13/2015 0907   PROT 6.7 12/13/2015 3903  ALBUMIN 3.5 12/13/2015 0907   AST 23 12/13/2015 0907   ALT 25 12/13/2015 0907   ALKPHOS 54 12/13/2015 0907   BILITOT 0.26 12/13/2015 0907   GFRNONAA >60 11/16/2015 0900   GFRAA >60 11/16/2015 0900         RADIOGRAPHY: No results found.    IMPRESSION/PLAN: STAGE IIA right breast cancer, post mastectomy, node +    It was a pleasure meeting the patient today. We discussed the risks, benefits, and side effects of radiotherapy. I recommend radiotherapy to the right chest wall and regional nodes to reduce her risk of locoregional recurrence by 2/3.  If her IM nodes can be targeted without excessive lung exposure I will consider this. Her lesion was near the nipple,  though LOQ  per patient history and MRI report  We discussed that radiation would take approximately 6 weeks to complete.  We spoke about acute effects including skin irritation and fatigue as well as much less common late effects including internal organ injury or irritation. We spoke in great depth about the latest technology that is used to minimize the risk of late effects for patients undergoing radiotherapy to the breast or chest wall. No guarantees of treatment were given. The patient is enthusiastic about proceeding with treatment.  I addressed her concerns regarding the safety of treatment and rare risk of secondary cancers.I look forward to participating in the patient's care. I will order CT simulation/treatment planning for next week. Consent signed.  Extremely Low Risk of pregnancy: Last menstrual cycle was 2.5 weeks ago. Pregnancy test negative earlier this month. I recommended another  pregnancy test today as a precaution. Against my medical advice, she adamantly declines this in our lab and states she will do a test on her own.  She is quite certain she is not pregnant.  I explained the risks of RT to a fetus and explained that she must use contraception while undergoing RT. I believe her risk of pregnancy today is extremely low.  I spent 60 minutes face to face with the patient and more than 50% of that time was spent in counseling and/or coordination of care. __________________________________________   Eppie Gibson, MD

## 2015-12-19 ENCOUNTER — Ambulatory Visit
Admission: RE | Admit: 2015-12-19 | Discharge: 2015-12-19 | Disposition: A | Discharge status: Home / Self Care | Payer: 59 | Source: Ambulatory Visit | Attending: Radiation Oncology | Admitting: Radiation Oncology

## 2015-12-19 DIAGNOSIS — Z170 Estrogen receptor positive status [ER+]: Principal | ICD-10-CM

## 2015-12-19 DIAGNOSIS — Z510 Encounter for antineoplastic radiation therapy: Secondary | ICD-10-CM | POA: Diagnosis not present

## 2015-12-19 DIAGNOSIS — C50511 Malignant neoplasm of lower-outer quadrant of right female breast: Secondary | ICD-10-CM

## 2015-12-19 NOTE — Progress Notes (Signed)
  Radiation Oncology         [phone] ________________________________  Name: Shelly Smith MRN: [id number]  Date: 12/19/2015  DOB: 10/16/1965  SIMULATION AND TREATMENT PLANNING NOTE    Outpatient  DIAGNOSIS:     ICD-9-CM ICD-10-CM   1. Malignant neoplasm of lower-outer quadrant of right breast of female, estrogen receptor positive ([place]) 174.5 C50.511    V86.0 Z17.0     NARRATIVE:  The patient was brought to the [facility].  Identity was confirmed.  All relevant records and images related to the planned course of therapy were reviewed.  The patient freely provided informed written consent to proceed with treatment after reviewing the details related to the planned course of therapy. The consent form was witnessed and verified by the simulation staff.    Then, the patient was set-up in a stable reproducible supine position for radiation therapy with her ipsilateral arm over her head, and her upper body secured in a custom-made Vac-lok device.  CT images were obtained.  Surface markings were placed.  The CT images were loaded into the planning software.    TREATMENT PLANNING NOTE: Treatment planning then occurred.  The radiation prescription was entered and confirmed.     A total of 5 medically necessary complex treatment devices were fabricated and supervised by me: 4 fields with MLCs for custom blocks to protect heart, and lungs;  and, a Vac-lok. MORE COMPLEX DEVICES MAY BE MADE IN DOSIMETRY FOR FIELD IN FIELD BEAMS FOR DOSE HOMOGENEITY.  I have requested : 3D Simulation which is medically necessary to give adequate dose to at risk tissues while sparing lungs and heart.  I have requested a DVH of the following structures: lungs, heart, esophagus and spinal cord.    The patient will receive 50 Gy in 25 fractions to the right chest wall with 2 tangential fields (also, 45-50Gy in 25 fractions to the IM nodes in these fields) and 50 Gy in 25 Fractions to SCV and axilla with  opposed beams.   This will be followed by a boost to the chest wall scar  Optical Surface Tracking Plan:  Since intensity modulated radiotherapy (IMRT) and 3D conformal radiation treatment methods are predicated on accurate and precise positioning for treatment, intrafraction motion monitoring is medically necessary to ensure accurate and safe treatment delivery. The ability to quantify intrafraction motion without excessive ionizing radiation dose can only be performed with optical surface tracking. Accordingly, surface imaging offers the opportunity to obtain 3D measurements of patient position throughout IMRT and 3D treatments without excessive radiation exposure. I am ordering optical surface tracking for this patient's upcoming course of radiotherapy.  ________________________________   Reference:  Ursula Alert, J, et al. Surface imaging-based analysis of intrafraction motion for breast radiotherapy patients.Journal of [place], n. 6, nov. 2014. ISSN [id number].  Available at: <http://www.jacmp.org/index.php/jacmp/article/view/4957>.    -----------------------------------  Eppie Gibson, MD

## 2015-12-20 ENCOUNTER — Ambulatory Visit: Payer: 59 | Attending: General Surgery | Admitting: Physical Therapy

## 2015-12-20 DIAGNOSIS — M25611 Stiffness of right shoulder, not elsewhere classified: Secondary | ICD-10-CM | POA: Diagnosis present

## 2015-12-20 DIAGNOSIS — Z483 Aftercare following surgery for neoplasm: Secondary | ICD-10-CM

## 2015-12-20 DIAGNOSIS — R222 Localized swelling, mass and lump, trunk: Secondary | ICD-10-CM | POA: Diagnosis not present

## 2015-12-20 NOTE — Therapy (Signed)
[place], [place], [address] Phone: [phone]   Fax:  [phone]  Physical Therapy Treatment  Patient Details  Name: Shelly Smith MRN: [id number] Date of Birth: [birth year] Referring Provider: Dr. Donne Hazel   Encounter Date: 12/20/2015      PT End of Session - 12/20/15 1239    Visit Number 2   Number of Visits 9   Date for PT Re-Evaluation 01/06/16   PT Start Time 2725   PT Stop Time 1100   PT Time Calculation (min) 45 min   Activity Tolerance Patient tolerated treatment well   Behavior During Therapy [facility] for tasks assessed/performed      Past Medical History:  Diagnosis Date  . Breast cancer of lower-inner quadrant of right female breast ([place]) 10/10/2015  . History of bronchitis     Past Surgical History:  Procedure Laterality Date  . MASTECTOMY W/ SENTINEL NODE BIOPSY Right 11/21/2015   Procedure: RIGHT TOTAL MASTECTOMY WITH RIGHT AXILLARY SENTINEL LYMPH NODE BIOPSY;  Surgeon: Rolm Bookbinder, MD;  Location: [facility];  Service: General;  Laterality: Right;  . NO PAST SURGERIES      There were no vitals filed for this visit.      Subjective Assessment - 12/20/15 1021    Subjective  Pt had the simulation yesterday. She is going to start radiation soon and does not think she will be able to keep all her appointments because of time constraints and also that she is doing better and thinks it will take time.  She plans to return to dancing next week  She feels that the sensation of "pulling" is much better though she still has some tightness across chest at pec major muscle    Pertinent History Right total mastectomy with one sentinel node biopsy 11//06/2015 She will have radiation.  She will not have to had chemotherapy.     Currently in Pain? No/denies  just tightness across chest             [facility] PT Assessment - 12/20/15 0001      AROM   Right Shoulder Flexion 148 Degrees   Right  Shoulder ABduction 150 Degrees                     [facility] Adult PT Treatment/Exercise - 12/20/15 0001      Shoulder Exercises: Sidelying   External Rotation AROM;Right;10 reps   ABduction AROM;10 reps  using breath to deepen stretch      Shoulder Exercises: Stretch   External Rotation Stretch 30 seconds   Wall Stretch - Flexion 30 seconds   Wall Stretch - ABduction 30 seconds   Table Stretch - Flexion 30 seconds   Other Shoulder Stretches goal post arms with elbow supported as needed lower trunk rotation,    Other Shoulder Stretches hands behind head with elbows back      Manual Therapy   Manual Lymphatic Drainage (MLD) light skin stretch to left anterior chest and abdomen to help with tightness in chest    Passive ROM to left shoulder in flexion, abduction and external rotation,                 PT Education - 12/20/15 1238    Education provided Yes   Education Details issued pt handouts from [facility] class with more ideas for shoulder ROM and stretches and encouraged her to call and attend if she would like to    [employer]) Educated Patient  Methods Explanation;Demonstration;Handout   Comprehension Verbalized understanding;Returned demonstration                Long Term Clinic Goals - 12/20/15 1242      CC Long Term Goal  #1   Title Patient with verbalize an understanding of lymphedema risk reduction precautions   Status Achieved     CC Long Term Goal  #2   Title Patient will be independent in a home exercise program   Status Achieved     CC Long Term Goal  #3   Title Patient will improve left shoulder abduction to 140 degrees so that she can achieve position needed for radiation therapy.   Baseline 128 on 12/02/2015, 150    Status Achieved     CC Long Term Goal  #4   Title Patient will report a decrease in "pulling" pain by 50% so they can perform daily activities with greater ease   Status Achieved            Plan - 12/20/15 1240     Clinical Impression Statement Pt feels that she is improving and that she will be able to continue with the exercises at home.  She does not feel that she has time to come to PT and radiation therapy too.  She has met intital goals and will attend [facility] class when she has time.  She knows she can ask for a reoder and return to PT if she does not improve as she would like to.    Rehab Potential Good   Clinical Impairments Affecting Rehab Potential none at this time   PT Next Visit Plan Discharge this episode       Patient will benefit from skilled therapeutic intervention in order to improve the following deficits and impairments:  Decreased skin integrity, Increased edema, Decreased knowledge of precautions, Decreased knowledge of use of DME, Decreased strength, Increased fascial restricitons, Decreased range of motion  Visit Diagnosis: Localized swelling, mass and lump, trunk  Stiffness of right shoulder joint  Aftercare following surgery for neoplasm     Problem List Patient Active Problem List   Diagnosis Date Noted  . Breast cancer of lower-outer quadrant of right female breast ([place]) 11/21/2015  . Breast cancer of lower-inner quadrant of right female breast ([place]) 10/10/2015   PHYSICAL THERAPY DISCHARGE SUMMARY  Visits from Start of Care: 2  Current functional level related to goals / functional outcomes: Pt is improving with self care at home    Remaining deficits: Feelings of tightness across left pec major area    Education / Equipment: Home exercise, encouraged pt to come to [facility] class  Plan: Patient agrees to discharge.  Patient goals were met. Patient is being discharged due to meeting the stated rehab goals.  ?????    Donato Heinz. Owens Shark PT  Norwood Levo 12/20/2015, 12:46 PM  [place], [place], [address] Phone: [phone]   Fax:  [phone]  Name: Shelly Smith MRN:  [id number] Date of Birth: [birth year]

## 2015-12-23 DIAGNOSIS — Z510 Encounter for antineoplastic radiation therapy: Secondary | ICD-10-CM | POA: Diagnosis not present

## 2015-12-23 NOTE — Addendum Note (Signed)
Addendum  created 12/23/15 1335 by Lyndle Herrlich, MD   Anesthesia Intra Blocks edited, Sign clinical note

## 2015-12-26 ENCOUNTER — Ambulatory Visit
Admission: RE | Admit: 2015-12-26 | Discharge: 2015-12-26 | Disposition: A | Discharge status: Home / Self Care | Payer: 59 | Source: Ambulatory Visit | Attending: Radiation Oncology | Admitting: Radiation Oncology

## 2015-12-26 DIAGNOSIS — Z170 Estrogen receptor positive status [ER+]: Principal | ICD-10-CM

## 2015-12-26 DIAGNOSIS — C50511 Malignant neoplasm of lower-outer quadrant of right female breast: Secondary | ICD-10-CM

## 2015-12-26 DIAGNOSIS — Z510 Encounter for antineoplastic radiation therapy: Secondary | ICD-10-CM | POA: Diagnosis not present

## 2015-12-26 NOTE — Addendum Note (Signed)
Addendum  created 12/26/15 1254 by Lyndle Herrlich, MD   Anesthesia Intra Blocks edited, Sign clinical note

## 2015-12-27 ENCOUNTER — Ambulatory Visit
Admission: RE | Admit: 2015-12-27 | Discharge: 2015-12-27 | Disposition: A | Discharge status: Home / Self Care | Payer: 59 | Source: Ambulatory Visit | Attending: Radiation Oncology | Admitting: Radiation Oncology

## 2015-12-27 DIAGNOSIS — Z510 Encounter for antineoplastic radiation therapy: Secondary | ICD-10-CM | POA: Diagnosis not present

## 2015-12-28 ENCOUNTER — Ambulatory Visit
Admission: RE | Admit: 2015-12-28 | Discharge: 2015-12-28 | Disposition: A | Discharge status: Home / Self Care | Payer: 59 | Source: Ambulatory Visit | Attending: Radiation Oncology | Admitting: Radiation Oncology

## 2015-12-28 DIAGNOSIS — C50511 Malignant neoplasm of lower-outer quadrant of right female breast: Secondary | ICD-10-CM

## 2015-12-28 DIAGNOSIS — Z510 Encounter for antineoplastic radiation therapy: Secondary | ICD-10-CM | POA: Diagnosis not present

## 2015-12-28 DIAGNOSIS — Z170 Estrogen receptor positive status [ER+]: Principal | ICD-10-CM

## 2015-12-28 MED ORDER — RADIAPLEXRX EX GEL
Freq: Once | CUTANEOUS | Status: AC
  Administered 2015-12-28: 15:00:00 via TOPICAL

## 2015-12-28 MED ORDER — ALRA NON-METALLIC DEODORANT (RAD-ONC)
1.0000 "application " | Freq: Once | TOPICAL | Status: AC
  Administered 2015-12-28: 1 via TOPICAL

## 2015-12-28 NOTE — Progress Notes (Signed)

## 2015-12-29 ENCOUNTER — Ambulatory Visit
Admission: RE | Admit: 2015-12-29 | Discharge: 2015-12-29 | Disposition: A | Discharge status: Home / Self Care | Payer: 59 | Source: Ambulatory Visit | Attending: Radiation Oncology | Admitting: Radiation Oncology

## 2015-12-29 DIAGNOSIS — Z510 Encounter for antineoplastic radiation therapy: Secondary | ICD-10-CM | POA: Diagnosis not present

## 2015-12-30 ENCOUNTER — Ambulatory Visit
Admission: RE | Admit: 2015-12-30 | Discharge: 2015-12-30 | Disposition: A | Discharge status: Home / Self Care | Payer: 59 | Source: Ambulatory Visit | Attending: Radiation Oncology | Admitting: Radiation Oncology

## 2015-12-30 DIAGNOSIS — Z510 Encounter for antineoplastic radiation therapy: Secondary | ICD-10-CM | POA: Diagnosis not present

## 2016-01-02 ENCOUNTER — Encounter: Payer: Self-pay | Admitting: Radiation Oncology

## 2016-01-02 ENCOUNTER — Ambulatory Visit
Admission: RE | Admit: 2016-01-02 | Discharge: 2016-01-02 | Disposition: A | Discharge status: Home / Self Care | Payer: 59 | Source: Ambulatory Visit | Attending: Radiation Oncology | Admitting: Radiation Oncology

## 2016-01-02 VITALS — BP 111/71 | HR 72 | Temp 98.3°F | Wt 104.0 lb

## 2016-01-02 DIAGNOSIS — C50511 Malignant neoplasm of lower-outer quadrant of right female breast: Secondary | ICD-10-CM

## 2016-01-02 DIAGNOSIS — Z170 Estrogen receptor positive status [ER+]: Principal | ICD-10-CM

## 2016-01-02 DIAGNOSIS — Z510 Encounter for antineoplastic radiation therapy: Secondary | ICD-10-CM | POA: Diagnosis not present

## 2016-01-02 NOTE — Progress Notes (Signed)
Ms. Barnhard is here for her 5th fraction of radiation to her Right Chest wall. She denies pain or fatigue. She reports no skin changes to her Right Chest. She does have dermabond present over her incision site. She is using Radiaplex twice daily. She has concerns about having a x-ray performed weekly during her radiation, and would like to talk to Dr. Isidore Moos about it.   BP 111/71   Pulse 72   Temp 98.3 F (36.8 C)   Wt 104 lb (47.2 kg)   SpO2 100% Comment: room air  BMI 17.85 kg/m    Wt Readings from Last 3 Encounters:  01/02/16 104 lb (47.2 kg)  12/13/15 102 lb 6.4 oz (46.4 kg)  12/13/15 104 lb 9.6 oz (47.4 kg)

## 2016-01-02 NOTE — Progress Notes (Signed)
   Weekly Management Note:  Outpatient    ICD-9-CM ICD-10-CM   1. Malignant neoplasm of lower-outer quadrant of right breast of female, estrogen receptor positive ([facility]) 174.5 C50.511    V86.0 Z17.0     Current Dose:  10 Gy  Projected Dose: 60 Gy   Narrative:  The patient presents for routine under treatment assessment.  CBCT/MVCT images/Port film x-rays were reviewed.  The chart was checked. Doing well. Has questions about weekly port films.  Physical Findings:  weight is 104 lb (47.2 kg). Her temperature is 98.3 F (36.8 C). Her blood pressure is 111/71 and her pulse is 72. Her oxygen saturation is 100%.   Wt Readings from Last 3 Encounters:  01/02/16 104 lb (47.2 kg)  12/13/15 102 lb 6.4 oz (46.4 kg)  12/13/15 104 lb 9.6 oz (47.4 kg)   No skin irritation so far in RT fields.  steri strips still in place on chest wall scar.  Impression:  The patient is tolerating radiotherapy.  Plan:  Continue radiotherapy as planned. Patient instructed to apply Radiplex to intact skin in treatment fields. She will not pull steristrips off.  Explained rationale for weekly ports.  She is agreeable to continuing them.   ________________________________   Eppie Gibson, M.D.

## 2016-01-03 ENCOUNTER — Ambulatory Visit
Admission: RE | Admit: 2016-01-03 | Discharge: 2016-01-03 | Disposition: A | Discharge status: Home / Self Care | Payer: 59 | Source: Ambulatory Visit | Attending: Radiation Oncology | Admitting: Radiation Oncology

## 2016-01-03 DIAGNOSIS — Z510 Encounter for antineoplastic radiation therapy: Secondary | ICD-10-CM | POA: Diagnosis not present

## 2016-01-04 ENCOUNTER — Ambulatory Visit
Admission: RE | Admit: 2016-01-04 | Discharge: 2016-01-04 | Disposition: A | Discharge status: Home / Self Care | Payer: 59 | Source: Ambulatory Visit | Attending: Radiation Oncology | Admitting: Radiation Oncology

## 2016-01-04 DIAGNOSIS — Z510 Encounter for antineoplastic radiation therapy: Secondary | ICD-10-CM | POA: Diagnosis not present

## 2016-01-05 ENCOUNTER — Ambulatory Visit
Admission: RE | Admit: 2016-01-05 | Discharge: 2016-01-05 | Disposition: A | Discharge status: Home / Self Care | Payer: 59 | Source: Ambulatory Visit | Attending: Radiation Oncology | Admitting: Radiation Oncology

## 2016-01-05 DIAGNOSIS — Z510 Encounter for antineoplastic radiation therapy: Secondary | ICD-10-CM | POA: Diagnosis not present

## 2016-01-06 ENCOUNTER — Ambulatory Visit
Admission: RE | Admit: 2016-01-06 | Discharge: 2016-01-06 | Disposition: A | Discharge status: Home / Self Care | Payer: 59 | Source: Ambulatory Visit | Attending: Radiation Oncology | Admitting: Radiation Oncology

## 2016-01-06 DIAGNOSIS — Z510 Encounter for antineoplastic radiation therapy: Secondary | ICD-10-CM | POA: Diagnosis not present

## 2016-01-10 ENCOUNTER — Ambulatory Visit
Admission: RE | Admit: 2016-01-10 | Discharge: 2016-01-10 | Disposition: A | Discharge status: Home / Self Care | Payer: 59 | Source: Ambulatory Visit | Attending: Radiation Oncology | Admitting: Radiation Oncology

## 2016-01-10 DIAGNOSIS — Z170 Estrogen receptor positive status [ER+]: Principal | ICD-10-CM

## 2016-01-10 DIAGNOSIS — Z510 Encounter for antineoplastic radiation therapy: Secondary | ICD-10-CM | POA: Diagnosis not present

## 2016-01-10 DIAGNOSIS — C50511 Malignant neoplasm of lower-outer quadrant of right female breast: Secondary | ICD-10-CM

## 2016-01-10 NOTE — Progress Notes (Signed)
   Weekly Management Note:  Outpatient    ICD-9-CM ICD-10-CM   1. Malignant neoplasm of lower-outer quadrant of right breast of female, estrogen receptor positive ([facility]) 174.5 C50.511    V86.0 Z17.0     Current Dose:  20 Gy  Projected Dose: 60 Gy   Narrative:  The patient presents for routine under treatment assessment.  CBCT/MVCT images/Port film x-rays were reviewed.  The chart was checked.   Patient was seen in the treatment area. She is doing well overall without any complaint except slight soreness over chest wall, right.  Physical Findings:  vitals were not taken for this visit.  Wt Readings from Last 3 Encounters:  01/02/16 104 lb (47.2 kg)  12/13/15 102 lb 6.4 oz (46.4 kg)  12/13/15 104 lb 9.6 oz (47.4 kg)   She has some early hyperpigmentation over the right chest wall. Skin is intact.  Impression:  The patient is tolerating radiotherapy.  Plan:  Continue radiotherapy as planned. Continue applying Radiaplex.  ________________________________   Eppie Gibson, M.D.   This document serves as a record of services personally performed by Eppie Gibson, MD. It was created on her behalf by Arlyce Harman, a trained medical scribe. The creation of this record is based on the scribe's personal observations and the provider's statements to them. This document has been checked and approved by the attending provider.

## 2016-01-11 ENCOUNTER — Ambulatory Visit
Admission: RE | Admit: 2016-01-11 | Discharge: 2016-01-11 | Disposition: A | Discharge status: Home / Self Care | Payer: 59 | Source: Ambulatory Visit | Attending: Radiation Oncology | Admitting: Radiation Oncology

## 2016-01-11 DIAGNOSIS — Z510 Encounter for antineoplastic radiation therapy: Secondary | ICD-10-CM | POA: Diagnosis not present

## 2016-01-12 ENCOUNTER — Ambulatory Visit
Admission: RE | Admit: 2016-01-12 | Discharge: 2016-01-12 | Disposition: A | Discharge status: Home / Self Care | Payer: 59 | Source: Ambulatory Visit | Attending: Radiation Oncology | Admitting: Radiation Oncology

## 2016-01-12 DIAGNOSIS — Z510 Encounter for antineoplastic radiation therapy: Secondary | ICD-10-CM | POA: Diagnosis not present

## 2016-01-13 ENCOUNTER — Ambulatory Visit
Admission: RE | Admit: 2016-01-13 | Discharge: 2016-01-13 | Disposition: A | Discharge status: Home / Self Care | Payer: 59 | Source: Ambulatory Visit | Attending: Radiation Oncology | Admitting: Radiation Oncology

## 2016-01-13 DIAGNOSIS — Z510 Encounter for antineoplastic radiation therapy: Secondary | ICD-10-CM | POA: Diagnosis not present

## 2016-01-17 ENCOUNTER — Ambulatory Visit
Admission: RE | Admit: 2016-01-17 | Discharge: 2016-01-17 | Disposition: A | Discharge status: Home / Self Care | Payer: 59 | Source: Ambulatory Visit | Attending: Radiation Oncology | Admitting: Radiation Oncology

## 2016-01-17 ENCOUNTER — Encounter: Payer: Self-pay | Admitting: Radiation Oncology

## 2016-01-17 VITALS — BP 95/74 | HR 85 | Temp 97.8°F | Resp 18 | Ht 64.0 in | Wt 106.0 lb

## 2016-01-17 DIAGNOSIS — Z510 Encounter for antineoplastic radiation therapy: Secondary | ICD-10-CM | POA: Diagnosis not present

## 2016-01-17 DIAGNOSIS — Z170 Estrogen receptor positive status [ER+]: Secondary | ICD-10-CM | POA: Diagnosis not present

## 2016-01-17 DIAGNOSIS — C50311 Malignant neoplasm of lower-inner quadrant of right female breast: Secondary | ICD-10-CM

## 2016-01-17 DIAGNOSIS — C50511 Malignant neoplasm of lower-outer quadrant of right female breast: Secondary | ICD-10-CM | POA: Diagnosis not present

## 2016-01-17 NOTE — Progress Notes (Signed)
Shelly Smith is here for her 14th fraction of radiation to her Right Chest wall. She denies pain or fatigue.  Skin with mild hyperpigmentation to her Right Chest. She does have dermabond present over her incision site. She is using Radiaplex twice daily.  Appetite was good over the holiday gained two pounds.   Wt Readings from Last 3 Encounters:  01/17/16 106 lb (48.1 kg)  01/02/16 104 lb (47.2 kg)  12/13/15 102 lb 6.4 oz (46.4 kg)  BP 95/74   Pulse 85   Temp 97.8 F (36.6 C) (Oral)   Resp 18   Ht 5\' 4"  (1.626 m)   Wt 106 lb (48.1 kg)   SpO2 100%   BMI 18.19 kg/m

## 2016-01-17 NOTE — Progress Notes (Signed)
  Radiation Oncology         [phone] ________________________________  Name: Shelly Smith MRN: [id number]  Date: 01/17/2016  DOB: 25-Feb-1965  Weekly Radiation Therapy Management    ICD-9-CM ICD-10-CM   1. Malignant neoplasm of lower-inner quadrant of right breast of female, estrogen receptor positive ([place]) 174.3 C50.311    V86.0 Z17.0      Current Dose: 28 Gy     Planned Dose:  60 Gy  Narrative . . . . . . . . The patient presents for routine under treatment assessment.                                    Shelly Smith is here for her 14th fraction of radiation to her Right Chest wall. She denies pain or fatigue. Denies discomfort in the chest area. Skin with mild hyperpigmentation to her Right Chest. She does have dermabond present over her incision site. She is using Radiaplex twice daily. Reports a good appetite.                                  Set-up films were reviewed.                                 The chart was checked. Physical Findings. . .  height is 5\' 4"  (1.626 m) and weight is 106 lb (48.1 kg). Her oral temperature is 97.8 F (36.6 C). Her blood pressure is 95/74 and her pulse is 85. Her respiration is 18 and oxygen saturation is 100%. . Weight essentially stable. Lungs are clear to auscultation bilaterally. Heart has regular rate and rhythm. Slight hyperpigmentation changes, no significant erythema of the right chest wall or axillary area. Impression . . . . . . . The patient is tolerating radiation. Plan . . . . . . . . . . . . Continue treatment as planned.  ________________________________   Blair Promise, PhD, MD  This document serves as a record of services personally performed by Gery Pray, MD. It was created on his behalf by Darcus Austin, a trained medical scribe. The creation of this record is based on the scribe's personal observations and the provider's statements to them. This document has been checked and approved by the attending provider.

## 2016-01-18 ENCOUNTER — Other Ambulatory Visit: Payer: Self-pay | Admitting: Hematology

## 2016-01-18 ENCOUNTER — Ambulatory Visit
Admission: RE | Admit: 2016-01-18 | Discharge: 2016-01-18 | Disposition: A | Discharge status: Home / Self Care | Payer: 59 | Source: Ambulatory Visit | Attending: Radiation Oncology | Admitting: Radiation Oncology

## 2016-01-18 DIAGNOSIS — C50511 Malignant neoplasm of lower-outer quadrant of right female breast: Secondary | ICD-10-CM | POA: Diagnosis not present

## 2016-01-18 DIAGNOSIS — Z510 Encounter for antineoplastic radiation therapy: Secondary | ICD-10-CM | POA: Diagnosis not present

## 2016-01-19 ENCOUNTER — Ambulatory Visit
Admission: RE | Admit: 2016-01-19 | Discharge: 2016-01-19 | Disposition: A | Discharge status: Home / Self Care | Payer: 59 | Source: Ambulatory Visit | Attending: Radiation Oncology | Admitting: Radiation Oncology

## 2016-01-19 DIAGNOSIS — C50511 Malignant neoplasm of lower-outer quadrant of right female breast: Secondary | ICD-10-CM | POA: Diagnosis not present

## 2016-01-19 DIAGNOSIS — Z510 Encounter for antineoplastic radiation therapy: Secondary | ICD-10-CM | POA: Diagnosis not present

## 2016-01-20 ENCOUNTER — Ambulatory Visit
Admission: RE | Admit: 2016-01-20 | Discharge: 2016-01-20 | Disposition: A | Discharge status: Home / Self Care | Payer: 59 | Source: Ambulatory Visit | Attending: Radiation Oncology | Admitting: Radiation Oncology

## 2016-01-20 DIAGNOSIS — Z510 Encounter for antineoplastic radiation therapy: Secondary | ICD-10-CM | POA: Diagnosis not present

## 2016-01-20 DIAGNOSIS — C50511 Malignant neoplasm of lower-outer quadrant of right female breast: Secondary | ICD-10-CM | POA: Diagnosis not present

## 2016-01-23 ENCOUNTER — Ambulatory Visit
Admission: RE | Admit: 2016-01-23 | Discharge: 2016-01-23 | Disposition: A | Discharge status: Home / Self Care | Payer: 59 | Source: Ambulatory Visit | Attending: Radiation Oncology | Admitting: Radiation Oncology

## 2016-01-23 ENCOUNTER — Encounter: Payer: Self-pay | Admitting: Radiation Oncology

## 2016-01-23 VITALS — BP 106/76 | HR 84 | Temp 97.7°F | Ht 64.0 in | Wt 105.6 lb

## 2016-01-23 DIAGNOSIS — Z170 Estrogen receptor positive status [ER+]: Principal | ICD-10-CM

## 2016-01-23 DIAGNOSIS — Z510 Encounter for antineoplastic radiation therapy: Secondary | ICD-10-CM | POA: Diagnosis not present

## 2016-01-23 DIAGNOSIS — C50511 Malignant neoplasm of lower-outer quadrant of right female breast: Secondary | ICD-10-CM | POA: Diagnosis not present

## 2016-01-23 NOTE — Progress Notes (Signed)
Shelly Smith presents for her 18th fraction of radiation to her Right Chest Wall. She denies pain. She does have some mild fatigue. Her Right Chest is slightly hyperpigmented and red. She continues to apply Radiaplex twice daily as directed. She does report some discomfort in her Right upper arm and her Right Axilla due to some mild swelling.   BP 106/76   Pulse 84   Temp 97.7 F (36.5 C)   Ht 5\' 4"  (1.626 m)   Wt 105 lb 9.6 oz (47.9 kg)   SpO2 99% Comment: room air  BMI 18.13 kg/m    Wt Readings from Last 3 Encounters:  01/23/16 105 lb 9.6 oz (47.9 kg)  01/17/16 106 lb (48.1 kg)  01/02/16 104 lb (47.2 kg)

## 2016-01-23 NOTE — Progress Notes (Signed)
   Weekly Management Note:  Outpatient    ICD-9-CM ICD-10-CM   1. Malignant neoplasm of lower-outer quadrant of right breast of female, estrogen receptor positive ([facility]) 174.5 C50.511    V86.0 Z17.0     Current Dose:  36 Gy  Projected Dose: 60 Gy   Narrative:  The patient presents for routine under treatment assessment.  CBCT/MVCT images/Port film x-rays were reviewed.  The chart was checked.   Uses radiaplex.  She is doing well overall - denies pain.    Physical Findings:  height is 5\' 4"  (1.626 m) and weight is 105 lb 9.6 oz (47.9 kg). Her temperature is 97.7 F (36.5 C). Her blood pressure is 106/76 and her pulse is 84. Her oxygen saturation is 99%.   Wt Readings from Last 3 Encounters:  01/23/16 105 lb 9.6 oz (47.9 kg)  01/17/16 106 lb (48.1 kg)  01/02/16 104 lb (47.2 kg)   She has moderate hyperpigmentation over the right chest wall. Skin is intact.  Impression:  The patient is tolerating radiotherapy.  Plan:  Continue radiotherapy as planned. Continue applying Radiaplex.  ________________________________   Eppie Gibson, M.D.   This document serves as a record of services personally performed by Eppie Gibson, MD. It was created on her behalf by Arlyce Harman, a trained medical scribe. The creation of this record is based on the scribe's personal observations and the provider's statements to them. This document has been checked and approved by the attending provider.

## 2016-01-24 ENCOUNTER — Ambulatory Visit
Admission: RE | Admit: 2016-01-24 | Discharge: 2016-01-24 | Disposition: A | Discharge status: Home / Self Care | Payer: 59 | Source: Ambulatory Visit | Attending: Radiation Oncology | Admitting: Radiation Oncology

## 2016-01-24 DIAGNOSIS — C50511 Malignant neoplasm of lower-outer quadrant of right female breast: Secondary | ICD-10-CM | POA: Diagnosis not present

## 2016-01-24 DIAGNOSIS — Z510 Encounter for antineoplastic radiation therapy: Secondary | ICD-10-CM | POA: Diagnosis not present

## 2016-01-25 ENCOUNTER — Ambulatory Visit
Admission: RE | Admit: 2016-01-25 | Discharge: 2016-01-25 | Disposition: A | Discharge status: Home / Self Care | Payer: 59 | Source: Ambulatory Visit | Attending: Radiation Oncology | Admitting: Radiation Oncology

## 2016-01-25 DIAGNOSIS — C50511 Malignant neoplasm of lower-outer quadrant of right female breast: Secondary | ICD-10-CM | POA: Diagnosis not present

## 2016-01-25 DIAGNOSIS — Z510 Encounter for antineoplastic radiation therapy: Secondary | ICD-10-CM | POA: Diagnosis not present

## 2016-01-26 ENCOUNTER — Ambulatory Visit
Admission: RE | Admit: 2016-01-26 | Discharge: 2016-01-26 | Disposition: A | Discharge status: Home / Self Care | Payer: 59 | Source: Ambulatory Visit | Attending: Radiation Oncology | Admitting: Radiation Oncology

## 2016-01-26 DIAGNOSIS — Z510 Encounter for antineoplastic radiation therapy: Secondary | ICD-10-CM | POA: Diagnosis not present

## 2016-01-26 DIAGNOSIS — C50511 Malignant neoplasm of lower-outer quadrant of right female breast: Secondary | ICD-10-CM | POA: Diagnosis not present

## 2016-01-27 ENCOUNTER — Ambulatory Visit
Admission: RE | Admit: 2016-01-27 | Discharge: 2016-01-27 | Disposition: A | Discharge status: Home / Self Care | Payer: 59 | Source: Ambulatory Visit | Attending: Radiation Oncology | Admitting: Radiation Oncology

## 2016-01-27 DIAGNOSIS — Z510 Encounter for antineoplastic radiation therapy: Secondary | ICD-10-CM | POA: Diagnosis not present

## 2016-01-27 DIAGNOSIS — C50511 Malignant neoplasm of lower-outer quadrant of right female breast: Secondary | ICD-10-CM | POA: Diagnosis not present

## 2016-01-30 ENCOUNTER — Ambulatory Visit
Admission: RE | Admit: 2016-01-30 | Discharge: 2016-01-30 | Disposition: A | Discharge status: Home / Self Care | Payer: 59 | Source: Ambulatory Visit | Attending: Radiation Oncology | Admitting: Radiation Oncology

## 2016-01-30 ENCOUNTER — Encounter: Payer: Self-pay | Admitting: Radiation Oncology

## 2016-01-30 VITALS — BP 94/72 | HR 82 | Temp 97.6°F | Wt 105.0 lb

## 2016-01-30 DIAGNOSIS — C50511 Malignant neoplasm of lower-outer quadrant of right female breast: Secondary | ICD-10-CM | POA: Diagnosis not present

## 2016-01-30 DIAGNOSIS — Z510 Encounter for antineoplastic radiation therapy: Secondary | ICD-10-CM | POA: Insufficient documentation

## 2016-01-30 DIAGNOSIS — C50311 Malignant neoplasm of lower-inner quadrant of right female breast: Secondary | ICD-10-CM | POA: Diagnosis not present

## 2016-01-30 DIAGNOSIS — Z170 Estrogen receptor positive status [ER+]: Secondary | ICD-10-CM | POA: Insufficient documentation

## 2016-01-30 MED ORDER — RADIAPLEXRX EX GEL
Freq: Once | CUTANEOUS | Status: AC
  Administered 2016-01-30: 10:00:00 via TOPICAL

## 2016-01-30 NOTE — Progress Notes (Addendum)
Ms. Shelly Smith is here for her 23rd fraction of radiation to her Right Chest Wall. She denies pain or fatigue. Her Right Chest is red with hyperpigmentation. She has mild irritation to the upper area of her Right Chest with some occasional itching. She is using Radiaplex 3 times daily and was given a second tube today. She is concerned about the tape that remains over her sugery site with regards to her upcoming boost treatment. She is concerned that she is unable to get Radiaplex to that area.   BP 94/72   Pulse 82   Temp 97.6 F (36.4 C)   Wt 105 lb (47.6 kg)   SpO2 100% Comment: room air  BMI 18.02 kg/m    Wt Readings from Last 3 Encounters:  01/30/16 105 lb (47.6 kg)  01/23/16 105 lb 9.6 oz (47.9 kg)  01/17/16 106 lb (48.1 kg)

## 2016-01-30 NOTE — Progress Notes (Signed)
   Weekly Management Note:  Outpatient    ICD-9-CM ICD-10-CM   1. Malignant neoplasm of lower-inner quadrant of right breast of female, estrogen receptor positive ([facility]) 174.3 C50.311 hyaluronate sodium (RADIAPLEXRX) gel   V86.0 Z17.0   2. Malignant neoplasm of lower-outer quadrant of right breast of female, estrogen receptor positive ([facility]) 174.5 C50.511    V86.0 Z17.0     Current Dose:  46 Gy  Projected Dose: 60 Gy   Narrative:  The patient presents for routine under treatment assessment.  CBCT/MVCT images/Port film x-rays were reviewed.  The chart was checked.   Uses radiaplex.  She is doing well overall - some skin irritation over right chest.  Asks about surgical steri strips that remain over chest wall scar.  Physical Findings:  weight is 105 lb (47.6 kg). Her temperature is 97.6 F (36.4 C). Her blood pressure is 94/72 and her pulse is 82. Her oxygen saturation is 100%.   Wt Readings from Last 3 Encounters:  01/30/16 105 lb (47.6 kg)  01/23/16 105 lb 9.6 oz (47.9 kg)  01/17/16 106 lb (48.1 kg)   She has moderate hyperpigmentation over the right chest wall. Skin is intact.  Impression:  The patient is tolerating radiotherapy.  Plan:  Continue radiotherapy as planned. Continue applying Radiaplex. Allow steri strips to fall off on their own.  ________________________________   Eppie Gibson, M.D.   This document serves as a record of services personally performed by Eppie Gibson, MD. It was created on her behalf by Arlyce Harman, a trained medical scribe. The creation of this record is based on the scribe's personal observations and the provider's statements to them. This document has been checked and approved by the attending provider.

## 2016-01-31 ENCOUNTER — Ambulatory Visit
Admission: RE | Admit: 2016-01-31 | Discharge: 2016-01-31 | Disposition: A | Discharge status: Home / Self Care | Payer: 59 | Source: Ambulatory Visit | Attending: Radiation Oncology | Admitting: Radiation Oncology

## 2016-01-31 DIAGNOSIS — Z510 Encounter for antineoplastic radiation therapy: Secondary | ICD-10-CM | POA: Diagnosis not present

## 2016-01-31 DIAGNOSIS — C50511 Malignant neoplasm of lower-outer quadrant of right female breast: Secondary | ICD-10-CM | POA: Diagnosis not present

## 2016-02-01 ENCOUNTER — Ambulatory Visit
Admission: RE | Admit: 2016-02-01 | Discharge: 2016-02-01 | Disposition: A | Discharge status: Home / Self Care | Payer: 59 | Source: Ambulatory Visit | Attending: Radiation Oncology | Admitting: Radiation Oncology

## 2016-02-01 DIAGNOSIS — C50511 Malignant neoplasm of lower-outer quadrant of right female breast: Secondary | ICD-10-CM | POA: Diagnosis not present

## 2016-02-01 DIAGNOSIS — Z510 Encounter for antineoplastic radiation therapy: Secondary | ICD-10-CM | POA: Diagnosis not present

## 2016-02-02 ENCOUNTER — Ambulatory Visit
Admission: RE | Admit: 2016-02-02 | Discharge: 2016-02-02 | Disposition: A | Discharge status: Home / Self Care | Payer: 59 | Source: Ambulatory Visit | Attending: Radiation Oncology | Admitting: Radiation Oncology

## 2016-02-02 DIAGNOSIS — Z510 Encounter for antineoplastic radiation therapy: Secondary | ICD-10-CM | POA: Diagnosis not present

## 2016-02-03 ENCOUNTER — Ambulatory Visit
Admission: RE | Admit: 2016-02-03 | Discharge: 2016-02-03 | Disposition: A | Discharge status: Home / Self Care | Payer: 59 | Source: Ambulatory Visit | Attending: Radiation Oncology | Admitting: Radiation Oncology

## 2016-02-03 DIAGNOSIS — Z510 Encounter for antineoplastic radiation therapy: Secondary | ICD-10-CM | POA: Diagnosis not present

## 2016-02-06 ENCOUNTER — Ambulatory Visit
Admission: RE | Admit: 2016-02-06 | Discharge: 2016-02-06 | Disposition: A | Discharge status: Home / Self Care | Payer: 59 | Source: Ambulatory Visit | Attending: Radiation Oncology | Admitting: Radiation Oncology

## 2016-02-06 ENCOUNTER — Encounter: Payer: Self-pay | Admitting: Radiation Oncology

## 2016-02-06 VITALS — BP 101/60 | HR 91 | Temp 97.9°F | Resp 16 | Ht 64.0 in | Wt 104.2 lb

## 2016-02-06 DIAGNOSIS — C50511 Malignant neoplasm of lower-outer quadrant of right female breast: Secondary | ICD-10-CM

## 2016-02-06 DIAGNOSIS — Z510 Encounter for antineoplastic radiation therapy: Secondary | ICD-10-CM | POA: Diagnosis not present

## 2016-02-06 DIAGNOSIS — Z170 Estrogen receptor positive status [ER+]: Principal | ICD-10-CM

## 2016-02-06 NOTE — Progress Notes (Signed)
   Weekly Management Note:  Outpatient    ICD-9-CM ICD-10-CM   1. Malignant neoplasm of lower-outer quadrant of right breast of female, estrogen receptor positive ([facility]) 174.5 C50.511    V86.0 Z17.0     Current Dose:  56 Gy  Projected Dose: 60 Gy   Narrative:  The patient presents for routine under treatment assessment.  CBCT/MVCT images/Port film x-rays were reviewed.  The chart was checked.   Uses radiaplex.  No new issues except some itching at times.   Physical Findings:  height is 5\' 4"  (1.626 m) and weight is 104 lb 3.2 oz (47.3 kg). Her oral temperature is 97.9 F (36.6 C). Her blood pressure is 101/60 and her pulse is 91. Her respiration is 16 and oxygen saturation is 99%.   Wt Readings from Last 3 Encounters:  02/06/16 104 lb 3.2 oz (47.3 kg)  01/30/16 105 lb (47.6 kg)  01/23/16 105 lb 9.6 oz (47.9 kg)   She has moderate erythema and hyperpigmentation over the right chest wall. Skin is intact. Steri-strips still in place  Impression:  The patient is tolerating radiotherapy.  Plan:  Continue radiotherapy as planned. Continue applying Radiaplex. Allow steri strips to fall off on their own. F/u in 1 mo. Apply hydrocortisone 1% cream prn itching.  ________________________________   Eppie Gibson, M.D.   This document serves as a record of services personally performed by Eppie Gibson, MD. It was created on her behalf by Arlyce Harman, a trained medical scribe. The creation of this record is based on the scribe's personal observations and the provider's statements to them. This document has been checked and approved by the attending provider.

## 2016-02-06 NOTE — Progress Notes (Addendum)
Shelly Smith is here for her 28rd fraction of radiation to her Right Chest Wall. She denies pain or fatigue. Her Right Chest is red with hyperpigmentation. She has mild irritation to the upper area of her Right Chest with some occasional itching. She is using Radiaplex 3 times daily. She is concerned about the tape that remains over her sugery site. She is concerned that she is unable to get Radiaplex to that area. EOT instructions given to continue to use the Radiaplex gel for two weeks and the purchase a lotion with vitamion E and use until she comes back to see Dr. Isidore Moos.  One month follow up card given to see Dr. Isidore Moos. Wt Readings from Last 3 Encounters:  02/06/16 104 lb 3.2 oz (47.3 kg)  01/30/16 105 lb (47.6 kg)  01/23/16 105 lb 9.6 oz (47.9 kg)  BP (!) 92/57   Pulse 77   Temp 97.9 F (36.6 C) (Oral)   Resp 16   Ht 5\' 4"  (1.626 m)   Wt 104 lb 3.2 oz (47.3 kg)   SpO2 99%   BMI 17.89 kg/m Left arm sitting BP 101/60   Pulse 91   Temp 97.9 F (36.6 C) (Oral)   Resp 16   Ht 5\' 4"  (1.626 m)   Wt 104 lb 3.2 oz (47.3 kg)   SpO2 99%   BMI 17.89 kg/m  Left arm standing

## 2016-02-07 ENCOUNTER — Ambulatory Visit
Admission: RE | Admit: 2016-02-07 | Discharge: 2016-02-07 | Disposition: A | Discharge status: Home / Self Care | Payer: 59 | Source: Ambulatory Visit | Attending: Radiation Oncology | Admitting: Radiation Oncology

## 2016-02-07 DIAGNOSIS — Z510 Encounter for antineoplastic radiation therapy: Secondary | ICD-10-CM | POA: Diagnosis not present

## 2016-02-08 ENCOUNTER — Ambulatory Visit
Admission: RE | Admit: 2016-02-08 | Discharge: 2016-02-08 | Disposition: A | Discharge status: Home / Self Care | Payer: 59 | Source: Ambulatory Visit | Attending: Radiation Oncology | Admitting: Radiation Oncology

## 2016-02-08 DIAGNOSIS — C50511 Malignant neoplasm of lower-outer quadrant of right female breast: Secondary | ICD-10-CM | POA: Diagnosis not present

## 2016-02-08 DIAGNOSIS — Z510 Encounter for antineoplastic radiation therapy: Secondary | ICD-10-CM | POA: Diagnosis not present

## 2016-02-09 ENCOUNTER — Telehealth: Payer: Self-pay | Admitting: *Deleted

## 2016-02-09 NOTE — Telephone Encounter (Signed)
Called pt to congratulate on completion of xrt. Discussed next step is survivorship. Denies questions or needs at this time. Encourage pt to call with concerns. Received verbal understanding.

## 2016-02-15 ENCOUNTER — Encounter: Payer: Self-pay | Admitting: Radiation Oncology

## 2016-02-15 NOTE — Progress Notes (Signed)
  Radiation Oncology         [phone] ________________________________  Name: Shelly Smith MRN: [id number]  Date: 02/15/2016  DOB: [birth year]  End of Treatment Note  Diagnosis:  Stage IIA Right Breast Invasive Ductal Carcinoma, ER/PR Positive, Grade 1     Indication for treatment:  Curative       Radiation treatment dates:   12/27/15 - 02/08/16  Site/dose:   Right Chest Wall, Axilla, SCV treated to 50 Gy in 25 fractions, and IM nodes to >45 Gy in 25 fractions, and then chest wall scar boosted an additional 10 Gy in 5 fractions.  Beams/energy:   Right Chest Wall and nodes : 3D  // 10X, 6X        Boost : En Face  //  6 MeV  Narrative: The patient tolerated radiation treatment relatively well. The patient experienced some radiation related skin changes, including erythema and hyperpigmentation over the right chest wall with associated skin itching.  Plan: The patient has completed radiation treatment. She was encouraged to use hydrocortisone 1% cream as needed for itching. The patient will return to radiation oncology clinic for routine followup in one month. I advised them to call or return sooner if they have any questions or concerns related to their recovery or treatment.  -----------------------------------  Eppie Gibson, MD  This document serves as a record of services personally performed by Eppie Gibson, MD. It was created on her behalf by Maryla Morrow, a trained medical scribe. The creation of this record is based on the scribe's personal observations and the provider's statements to them. This document has been checked and approved by the attending provider.

## 2016-02-24 ENCOUNTER — Other Ambulatory Visit: Payer: Self-pay | Admitting: Hematology

## 2016-02-24 DIAGNOSIS — Z170 Estrogen receptor positive status [ER+]: Principal | ICD-10-CM

## 2016-02-24 DIAGNOSIS — C50311 Malignant neoplasm of lower-inner quadrant of right female breast: Secondary | ICD-10-CM

## 2016-03-05 ENCOUNTER — Encounter: Payer: Self-pay | Admitting: Radiation Oncology

## 2016-03-09 ENCOUNTER — Encounter: Payer: Self-pay | Admitting: Radiation Oncology

## 2016-03-09 ENCOUNTER — Ambulatory Visit
Admission: RE | Admit: 2016-03-09 | Discharge: 2016-03-09 | Disposition: A | Discharge status: Home / Self Care | Payer: 59 | Source: Ambulatory Visit | Attending: Radiation Oncology | Admitting: Radiation Oncology

## 2016-03-09 VITALS — BP 96/68 | HR 86 | Temp 97.9°F | Wt 105.8 lb

## 2016-03-09 DIAGNOSIS — Z923 Personal history of irradiation: Secondary | ICD-10-CM | POA: Diagnosis not present

## 2016-03-09 DIAGNOSIS — Z170 Estrogen receptor positive status [ER+]: Secondary | ICD-10-CM | POA: Insufficient documentation

## 2016-03-09 DIAGNOSIS — C50511 Malignant neoplasm of lower-outer quadrant of right female breast: Secondary | ICD-10-CM | POA: Insufficient documentation

## 2016-03-09 HISTORY — DX: Personal history of irradiation: Z92.3

## 2016-03-09 NOTE — Progress Notes (Signed)
[place]  [phone] [phone]  Clinic follow up Note   Patient Care Team: Azucena Fallen, MD as PCP - General (Obstetrics and Gynecology) Kyung Rudd, MD as Consulting Physician (Radiation Oncology) Truitt Merle, MD as Consulting Physician (Hematology) Erroll Luna, MD as Consulting Physician (General Surgery) 03/13/2016  CHIEF COMPLAINT:  Follow up right breast cancer  Oncology History   Breast cancer of lower-inner quadrant of right female breast [facility])   Staging form: Breast, AJCC 7th Edition   - Clinical stage from 10/04/2015: Stage IA (T1c, N0, M0) - Signed by Truitt Merle, MD on 10/12/2015   - Pathologic stage from 11/21/2015: Stage IIA (T1c, N1a, cM0) - Signed by Truitt Merle, MD on 12/12/2015      Breast cancer of lower-inner quadrant of right female breast ([place])   10/04/2015 Mammogram    Diagnostic mammogram and US showed a 1.6cm lobulated irregular high density mass in the lower inner quadrant right breast, possible chest wall invasion and skin involvement, right axillar [place] (-)      10/05/2015 Initial Diagnosis    Breast cancer of lower-inner quadrant of right female breast ([place])      10/05/2015 Initial Biopsy    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       10/05/2015 Receptors her2    ER 90% +, PR 95%+, strong staining, Her2-, Ki67 10%      10/05/2015 Oncotype testing    Oncotype recurrence score of 19, intermediate risk, predicts 10 year distant recurrence risk of 12% with tamoxifen.      10/13/2015 Imaging    Bilateral breast MRI with and without contrast showed the known 1.7 x 1.7 x 1.0 cm enhancing mass, and a 2.0 x 1.4 x 3.6 cm area of non-mass enhancement. Left breast was negative. Lymph nodes appear normal.      10/13/2015 -  Anti-estrogen oral therapy    Tamoxifen 20 mg once daily      11/02/2015 Pathology Results    MRI guided lateral right breast no masses enhancement biopsy showed atypical  ductal hyperplasia and PASH       11/21/2015 Surgery    Right breast mastectomy and SLN biopsy by Dr. Donne Hazel       11/21/2015 Pathology Results    Right breast mastectomy showed 1.6cm invasive ductal carcinoma, G1, (+) LVI, (+) perineural invasion, one SLN was positive, margins were negative       12/27/2015 - 02/08/2016 Radiation Therapy    Adjuvant radiation to right Chest Wall, Axilla, SCV treated to 50 Gy in 25 fractions, and IM nodes to >45 Gy in 25 fractions, and then chest wall scar boosted an additional 10 Gy in 5 fractions.       HISTORY OF PRESENTING ILLNESS:  Shelly Smith 51 y.o. female is here because of Her recently diagnosed right breast cancer. She is accompanied by her husband to my clinic today.  She noticed a small right breast nodule below the nipple in the summer 2016, no nipple discharge, skin change, pain or other symptoms. She saw her gynecologist a few months later, and breast exam did confirm the breast nodule. Mammogram was ordered, but was not scheduled. She noticed some in skin retraction in the area in earlier this year. She finally scheduled for her mammogram a few weeks ago, which showed a 1.6 cm lobulated mass in the right breast lower inner quadrant. She underwent ultrasound-guided core needle biopsy the next day, which showed invasive lobular  carcinoma, ER/PR strongly positive, HER-2 negative. She was referred to [place] for further management.  She feels well overall. No pain, or other discomfort. She denies any weight loss, change of appetite or energy level lately. She is to have mammograms every other year, no family history of breast cancer or personal history of breast disease or biopsy. She is married, lives with her husband, the relocated from [place] to [place] last spring. She has 2 adult daughters. She is oriented in from [place].   GYN HISTORY  Menarchal: 13 LMP: 08/2015 Contraceptive: no  HRT: no  G2P2: two daughters 59 and 29 yo   CURRENT  THERAPY: Tamoxifen 20 mg once daily, started on 10/13/2015  INTERIM HISTORY:  Shelly Smith returns for follow-up. She has been doing well with tamoxifen. She occasionally forgets to take it for a day or two. When she does this, she does experience some spotting, but otherwise doesn't get a menstrual period anymore. She has a good ROM in her right arm. She has some sensitivity and pain in her upper right arm from surgery, but no swelling. Denies abdominal cramps, hot flashes, or any other concerns.   MEDICAL HISTORY:  Past Medical History:  Diagnosis Date  . Breast cancer of lower-inner quadrant of right female breast ([place]) 10/10/2015  . History of bronchitis   . History of radiation therapy 12/27/15- 02/08/16   Right Chest Wall, Axilla, SCV treated to 50 Gy in 25 fractions, and IM nodes to >45 Gy in 25 fractions, and then chest wall scar boosted an additional 10 Gy in 5 fractions.    SURGICAL HISTORY: Past Surgical History:  Procedure Laterality Date  . MASTECTOMY W/ SENTINEL NODE BIOPSY Right 11/21/2015   Procedure: RIGHT TOTAL MASTECTOMY WITH RIGHT AXILLARY SENTINEL LYMPH NODE BIOPSY;  Surgeon: Rolm Bookbinder, MD;  Location: [facility] [place];  Service: General;  Laterality: Right;  . NO PAST SURGERIES      SOCIAL HISTORY: Social History   Social History  . Marital status: Married    Spouse name: N/A  . Number of children: N/A  . Years of education: N/A   Occupational History  . Not on file.   Social History Main Topics  . Smoking status: Never Smoker  . Smokeless tobacco: Never Used  . Alcohol use Yes     Comment: social drinker   . Drug use: No  . Sexual activity: Not on file   Other Topics Concern  . Not on file   Social History Narrative  . No narrative on file    FAMILY HISTORY: Family History  Problem Relation Age of Onset  . Hypertension Mother   . Hypertension Father   . Cancer Cousin     brain tumor     ALLERGIES:  is allergic to no known allergies.  MEDICATIONS:    Current Outpatient Prescriptions  Medication Sig Dispense Refill  . cholecalciferol (VITAMIN D) 1000 units tablet Take 1,000 Units by mouth daily.    . Multiple Vitamin (MULTIVITAMIN WITH MINERALS) TABS tablet Take 1 tablet by mouth daily.    . tamoxifen (NOLVADEX) 20 MG tablet TAKE 1 TABLET BY MOUTH EVERY DAY 30 tablet 3  . oxyCODONE (OXY IR/ROXICODONE) 5 MG immediate release tablet Take 1 tablet (5 mg total) by mouth every 6 (six) hours as needed for moderate pain, severe pain or breakthrough pain. (Patient not taking: Reported on 01/30/2016) 10 tablet 0   No current facility-administered medications for this visit.     REVIEW OF SYSTEMS:  Constitutional: Denies fevers, chills or abnormal night sweats (+) spotting Eyes: Denies blurriness of vision, double vision or watery eyes Ears, nose, mouth, throat, and face: Denies mucositis or sore throat Respiratory: Denies cough, dyspnea or wheezes Cardiovascular: Denies palpitation, chest discomfort or lower extremity swelling Gastrointestinal:  Denies nausea, heartburn or change in bowel habits Skin: Denies abnormal skin rashes Lymphatics: Denies new lymphadenopathy or easy bruising Neurological:Denies numbness, tingling or new weaknesses Behavioral/Psych: Mood is stable, no new changes  Musculoskeletal: (+) upper right arm sensitivity from surgery All other systems were reviewed with the patient and are negative.  PHYSICAL EXAMINATION: ECOG PERFORMANCE STATUS: 0 - Asymptomatic  Vitals:   03/13/16 0927  BP: 105/71  Pulse: 72  Resp: 18  Temp: 97.7 F (36.5 C)   Filed Weights   03/13/16 0927  Weight: 106 lb 6.4 oz (48.3 kg)   GENERAL:alert, no distress and comfortable SKIN: skin color, texture, turgor are normal, no rashes or significant lesions EYES: normal, conjunctiva are pink and non-injected, sclera clear OROPHARYNX:no exudate, no erythema and lips, buccal mucosa, and tongue normal  NECK: supple, thyroid normal size,  non-tender, without nodularity LYMPH:  no palpable lymphadenopathy in the cervical, axillary or inguinal LUNGS: clear to auscultation and percussion with normal breathing effort HEART: regular rate & rhythm and no murmurs and no lower extremity edema ABDOMEN:abdomen soft, non-tender and normal bowel sounds Musculoskeletal:no cyanosis of digits and no clubbing  PSYCH: alert & oriented x 3 with fluent speech NEURO: no focal motor/sensory deficits Breasts: Breast inspection showed Right breast is surgically absent, incision is healing well, no discharge or skin erythema. Some mild skin pigmentation changes from radiation. Exam of left breast and bilateral axillas showed no palpable mass or adenopathy.  LABORATORY DATA:  I have reviewed the data as listed CBC Latest Ref Rng & Units 03/13/2016 12/13/2015 11/16/2015  WBC 3.9 - 10.3 10e3/uL 3.5(L) 3.6(L) 3.8(L)  Hemoglobin 11.6 - 15.9 g/dL 14.0 13.8 15.3(H)  Hematocrit 34.8 - 46.6 % 39.8 39.8 43.6  Platelets 145 - 400 10e3/uL 139(L) 121(L) 128(L)   CMP Latest Ref Rng & Units 03/13/2016 12/13/2015 11/16/2015  Glucose 70 - 140 mg/dl 117 121 95  BUN 7.0 - 26.0 mg/dL 14.7 11.6 12  Creatinine 0.6 - 1.1 mg/dL 0.7 0.7 0.62  Sodium 136 - 145 mEq/L 141 140 139  Potassium 3.5 - 5.1 mEq/L 3.7 4.1 3.8  Chloride 101 - 111 mmol/L - - 110  CO2 22 - 29 mEq/L 27 24 25   Calcium 8.4 - 10.4 mg/dL 9.2 8.8 9.2  Total Protein 6.4 - 8.3 g/dL 7.0 6.7 -  Total Bilirubin 0.20 - 1.20 mg/dL 0.46 0.26 -  Alkaline Phos 40 - 150 U/L 49 54 -  AST 5 - 34 U/L 23 23 -  ALT 0 - 55 U/L 22 25 -   PATHOLOGY REPORT  Diagnosis 11/02/2015 Breast, right, needle core biopsy, lateral - 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 - SEE COMMENT. Microscopic Comment There is a small focus of atypical ductal hyperplasia, supported by a positive stain for E-Cadherin and a negative stain for cytokeratin 5/6. The results were called  to the [place] on 11/03/2015  Diagnosis 10/05/2015 Breast, right, needle core biopsy INVASIVE DUCTAL CARCINOMA, GRADE 2 Results: IMMUNOHISTOCHEMICAL AND MORPHOMETRIC ANALYSIS PERFORMED MANUALLY Estrogen Receptor: 90%, POSITIVE, STRONG STAINING INTENSITY Progesterone Receptor: 95%, POSITIVE, STRONG STAINING INTENSITY Proliferation Marker Ki67: 10% Results: HER2 - NEGATIVE RATIO OF HER2/CEP17 SIGNALS 1.42 AVERAGE HER2 COPY  NUMBER PER CELL 2.20  ONCOTYPE DX RS 19, predicts 10 year distance recurrence risk of 12% with tamoxifen   Diagnosis 11/21/2015 1. Breast, simple mastectomy, Right - INVASIVE DUCTAL CARCINOMA GRADE I/III, SPANNING 1.6 CM. - DUCTAL CARICNOMA IN SITU, INTERMEDIATE GRADE. - LYMPHOVASCULAR INVASION IS IDENTIFIED. - PERINEURAL INVASION IS IDENTIFIED. - INVASIVE CARCINOMA IS FOCALLY 0.1 CM TO THE ANTERIOR MARGIN. - SEE ONCOLOGY TABLE BELOW. 2. Lymph node, sentinel, biopsy, Right Axillary - METASTATIC CARCINOMA IN 1 OF 1 LYMPH NODE (1/1). Microscopic Comment 1. BREAST, INVASIVE TUMOR, WITH LYMPH NODES PRESENT Specimen, including laterality and lymph node sampling (sentinel, non-sentinel): Right breast and right axillary lymph node. Procedure: Simple mastectomy and one lymph node resection. Histologic type: Ductal Grade: I Tubule formation: 2 Nuclear pleomorphism: 2 Mitotic:1 Tumor size (gross measurement): 1.6 cm Margins: Invasive, distance to closest margin: Focally 0.1 cm to the anterior margin. Lymphovascular invasion: Present. Ductal carcinoma in situ: Present. Grade: Intermediate. Extensive intraductal component: Not identified. Lobular neoplasia: Not identified. Tumor focality: Unifocal Extent of tumor: Confined to breast parenchyma. Lymph nodes: 1 of 3 FINAL for Shelly Smith, Shelly Smith ([id number]) Microscopic Comment(continued) Examined: 1 Sentinel 0 Non-sentinel 1 Total Lymph nodes with metastasis: 1 Isolated tumor cells (< 0.2 mm):  0 Micrometastasis: (> 0.2 mm and < 2.0 mm): 0 Macrometastasis: (> 2.0 mm): 1 Extracapsular extension: Not identified. Breast prognostic profile: Case [phone] Estrogen receptor: 90%, strong Progesterone receptor: 95%, strong. Her 2 neu: No amplification was detected. The ratio was 1.42. Ki-67: 10% Non-neoplastic breast: Fibrocystic changes, pseudoangiomatous stromal hyperplasia (PASH), and healing biopsy sites. TNM: pT1c, pN1a Comments: There are two lesions containing biopsy clips. The 1.6 cm lesion in the mid/inferior lateral specimen reveals grade I invasive ductal carcinoma. This tumor is focally 0.1 cm to the anterior margin. In addition, there is a 0.8 cm vaguely nodular lesion present lateral to this larger mass, containing a dumbbell shaped clip. Histologic evaluation of this area reveals benign fibrocystic changes and pseudoangiomatous stromal hyperplasia with a healing biopsy site. No carcinoma is identified here. (JBK:gt, 11/22/15)  RADIOGRAPHIC STUDIES: I have personally reviewed the radiological images as listed and agreed with the findings in the report.  MRI Breast b/l 10/13/2015 IMPRESSION: Irregular enhancing mass within the lower outer right breast compatible with recently biopsied right breast carcinoma. There a few small spicules of this mass extending posteriorly toward the pectoralis muscle. No definitive pectoralis muscle enhancement to suggest invasion identified on current examination.  Within the lateral aspect of the right breast there is a 3.6 cm area of suspicious non mass enhancement.  ASSESSMENT & PLAN:  51 y.o. premenopausal [place] woman, presented with a palpable right breast mass for one year.  1. Breast cancer of the lower inner quadrant of right female breast, pT1cN1aM0, stage IIA, ER/PR strongly positive, HER-2 negative, grade 2, Oncotype RS 19 --We previously discussed her surgical pathology findings with patient in details. -She had a  complete surgical resection with negative margins, unfortunately the 1 sentinel lymph nodes was positive.  -the Oncotype Dx was done on her initial biopsy before suregery, the result was reviewed with her in details. She has intermedia risk based on the recurrence score, which predicts 10 year distant recurrence after 5 years of tamoxifen 12%. The benefit of chemotherapy in the intermedia risk group is small and controversial. Given her relatively low score in the intermedia group, I did not recommend adjuvant chemotherapy. She agrees with the plan -Her surgical path showed low grade tumor, low KI67, I do not  think she needs mammaprint, which will likely be low risk disease if we did  -She started tamoxifen before surgery, has been tolerating well, without significant side effects. We'll continue for 10 years. The alternative option of switching her to aromatase inhibitor after she became postmenopause was also discussed with her. -She has completed adjuvant chest wall and asked her radiation, tolerated well. -She is clinically doing well, lab results reviewed with her, exam was unremarkable, no clinical concern for recurrence. -We again discussed breast cancer surveillance. She will continue screening left breast mammogram once a year, I encouraged her to do self exam, and a follow-up with [place] with lab every 3-4 months in the first 2-3 years, then every 6 months afterwards -She does not plan to do a breast reconstruction. She does not want a breast prosthesis.   Plan -Continue tamoxifen -Mammogram due in September 2018. I will order this at our next visit.  -Lab and follow-up in 3 months  All questions were answered. The patient knows to call the clinic with any problems, questions or concerns.  I spent 20 minutes counseling the patient face to face. The total time spent in the appointment was 25 minutes and more than 50% was on counseling.  This document serves as a record of services personally  performed by Truitt Merle, MD. It was created on her behalf by Martinique Casey, a trained medical scribe. The creation of this record is based on the scribe's personal observations and the provider's statements to them. This document has been checked and approved by the attending provider.  I have reviewed the above documentation for accuracy and completeness and I agree with the above.  Truitt Merle  03/13/2016

## 2016-03-09 NOTE — Progress Notes (Signed)
Radiation Oncology         [phone] ________________________________  Name: Shelly Smith MRN: [id number]  Date: 03/09/2016  DOB: [birth year]  Follow-Up Visit Note  Outpatient  CC: Elveria Royals, MD  Azucena Fallen, MD  Diagnosis and Prior Radiotherapy:    ICD-9-CM ICD-10-CM   1. Malignant neoplasm of lower-outer quadrant of right breast of female, estrogen receptor positive ([place]) 174.5 C50.511    V86.0 Z17.0     Stage IIA Right Breast invasive ductal carcinoma, ER/PR Positive, Grade 1, status post right total mastectomy and irradiation  12/27/15 - 02/08/16 : Right Chest Wall, Axilla, IM nodes, SCV treated to 60 Gy in 30 fractions  CHIEF COMPLAINT: Here for follow-up and surveillance of right breast cancer  Narrative:  The patient returns today for routine follow-up of radiation completed 02/08/16.    On review of systems, the patient reports her energy levels have improved. She reports some peeling to the treated area, which has improved. She is using Radiaplex once daily. She is currently taking Tamoxifen, and is scheduled to follow up with Dr. Burr Medico on 03/13/16. The patient has a survivorship appointment in April  ALLERGIES:  is allergic to no known allergies.  Meds: Current Outpatient Prescriptions  Medication Sig Dispense Refill  . cholecalciferol (VITAMIN D) 1000 units tablet Take 1,000 Units by mouth daily.    . Multiple Vitamin (MULTIVITAMIN WITH MINERALS) TABS tablet Take 1 tablet by mouth daily.    . tamoxifen (NOLVADEX) 20 MG tablet TAKE 1 TABLET BY MOUTH EVERY DAY 30 tablet 3  . oxyCODONE (OXY IR/ROXICODONE) 5 MG immediate release tablet Take 1 tablet (5 mg total) by mouth every 6 (six) hours as needed for moderate pain, severe pain or breakthrough pain. (Patient not taking: Reported on 01/30/2016) 10 tablet 0   No current facility-administered medications for this encounter.     Physical Findings: The patient is in no acute distress. Patient is alert and oriented.  weight is 105 lb 12.8 oz (48 kg). Her temperature is 97.9 F (36.6 C). Her blood pressure is 96/68 and her pulse is 86. Her oxygen saturation is 99%.     residual erythema and hyperpigmentation over the right chest, but this is healing well. No lymphedema in the upper extremities.  Lab Findings: Lab Results  Component Value Date   WBC 3.6 (L) 12/13/2015   HGB 13.8 12/13/2015   HCT 39.8 12/13/2015   MCV 95.2 12/13/2015   PLT 121 (L) 12/13/2015    Radiographic Findings: No results found.  Impression/Plan:  The patient is healing well from radiation.   I advised the patient to use Vitamin E oil twice a day on the skin of the treatment area instead of Radiaplex. Alternatively, she could add some of the oil to a mild lotion and use this on the treatment area.  I would like the patient to ask PT about wearing a compression sleeve while exercising, as this may no longer be necessary. The patient knows to call the PT office to ask.  We discussed the rare risk of radiation pneumonitis, and the associated symptoms. The patient understands to call if she should have any concerns in this regard.  The patient will follow up with me on an as needed basis. She will continue to follow with Dr. Burr Medico as indicated.  _____________________________________   Eppie Gibson, MD  This document serves as a record of services personally performed by Eppie Gibson, MD. It was created on her behalf by Maryla Morrow, a  trained medical scribe. The creation of this record is based on the scribe's personal observations and the provider's statements to them. This document has been checked and approved by the attending provider.

## 2016-03-09 NOTE — Progress Notes (Signed)
Shelly Smith presents for follow up of radiaton completed 02/08/16 to her Right Chest Wall. She denies. Her energy level has improved. She is taking Tamoxifen. Her next appointment with Dr. Burr Medico is 03/13/16. She has a survivorship appointment in April, but is hesitant about going to this appointment at this time. Her Right Chest has healed. She tells me she had some peeling, but it has improved. She is using the Radiaplex once daily. She will use a vitamin E cream when completed.   BP 96/68   Pulse 86   Temp 97.9 F (36.6 C)   Wt 105 lb 12.8 oz (48 kg)   SpO2 99% Comment: room air  BMI 18.16 kg/m    Wt Readings from Last 3 Encounters:  03/09/16 105 lb 12.8 oz (48 kg)  02/06/16 104 lb 3.2 oz (47.3 kg)  01/30/16 105 lb (47.6 kg)

## 2016-03-09 NOTE — Addendum Note (Signed)
Encounter addended by: Ernst Spell, RN on: 03/09/2016 11:50 AM<BR>    Actions taken: Charge Capture section accepted

## 2016-03-13 ENCOUNTER — Ambulatory Visit (HOSPITAL_BASED_OUTPATIENT_CLINIC_OR_DEPARTMENT_OTHER): Payer: 59 | Admitting: Hematology

## 2016-03-13 ENCOUNTER — Telehealth: Payer: Self-pay | Admitting: Hematology

## 2016-03-13 ENCOUNTER — Encounter: Payer: Self-pay | Admitting: Hematology

## 2016-03-13 ENCOUNTER — Other Ambulatory Visit (HOSPITAL_BASED_OUTPATIENT_CLINIC_OR_DEPARTMENT_OTHER): Payer: 59

## 2016-03-13 VITALS — BP 105/71 | HR 72 | Temp 97.7°F | Resp 18 | Ht 64.0 in | Wt 106.4 lb

## 2016-03-13 DIAGNOSIS — Z170 Estrogen receptor positive status [ER+]: Principal | ICD-10-CM

## 2016-03-13 DIAGNOSIS — Z79811 Long term (current) use of aromatase inhibitors: Secondary | ICD-10-CM

## 2016-03-13 DIAGNOSIS — C50311 Malignant neoplasm of lower-inner quadrant of right female breast: Secondary | ICD-10-CM

## 2016-03-13 LAB — COMPREHENSIVE METABOLIC PANEL
ALBUMIN: 3.9 g/dL (ref 3.5–5.0)
ALK PHOS: 49 U/L (ref 40–150)
ALT: 22 U/L (ref 0–55)
ANION GAP: 9 meq/L (ref 3–11)
AST: 23 U/L (ref 5–34)
BUN: 14.7 mg/dL (ref 7.0–26.0)
CALCIUM: 9.2 mg/dL (ref 8.4–10.4)
CHLORIDE: 105 meq/L (ref 98–109)
CO2: 27 mEq/L (ref 22–29)
CREATININE: 0.7 mg/dL (ref 0.6–1.1)
EGFR: >90 mL/min/{1.73_m2} (ref >90)
Glucose: 117 mg/dl (ref 70–140)
POTASSIUM: 3.7 meq/L (ref 3.5–5.1)
Sodium: 141 mEq/L (ref 136–145)
Total Bilirubin: 0.46 mg/dL (ref 0.20–1.20)
Total Protein: 7 g/dL (ref 6.4–8.3)

## 2016-03-13 LAB — CBC WITH DIFFERENTIAL/PLATELET
BASO%: 0.3 % (ref 0.0–2.0)
BASOS ABS: 0 10*3/uL (ref 0.0–0.1)
EOS ABS: 0.1 10*3/uL (ref 0.0–0.5)
EOS%: 1.6 % (ref 0.0–7.0)
HEMATOCRIT: 39.8 % (ref 34.8–46.6)
HEMOGLOBIN: 14 g/dL (ref 11.6–15.9)
LYMPH#: 0.7 10*3/uL — AB (ref 0.9–3.3)
LYMPH%: 19.4 % (ref 14.0–49.7)
MCH: 34.6 pg — AB (ref 25.1–34.0)
MCHC: 35.2 g/dL (ref 31.5–36.0)
MCV: 98.1 fL (ref 79.5–101.0)
MONO#: 0.3 10*3/uL (ref 0.1–0.9)
MONO%: 7.4 % (ref 0.0–14.0)
NEUT#: 2.5 10*3/uL (ref 1.5–6.5)
NEUT%: 71.3 % (ref 38.4–76.8)
PLATELETS: 139 10*3/uL — AB (ref 145–400)
RBC: 4.06 10*6/uL (ref 3.70–5.45)
RDW: 12.7 % (ref 11.2–14.5)
WBC: 3.5 10*3/uL — ABNORMAL LOW (ref 3.9–10.3)

## 2016-03-13 NOTE — Telephone Encounter (Signed)
Appointments scheduled per 03/13/16 los. Patient was given a copy of the AVS report and appointment schedule per 03/13/16 los. °

## 2016-03-19 NOTE — Progress Notes (Signed)
Electrom Boost Treatment Planning Note Outpatient  Diagnosis:   Malignant neoplasm of lower-outer quadrant of right breast of female, estrogen receptor positive ([place]) 174.5 C50.511  V86.0 Z17.0    The patient's CT images from her free-breathing simulation were reviewed to plan her boost treatment to her right chest wall scar.  The boost  will be delivered with 6 MeV electrons, with an en face field, and custom electron cut out block. [place] plan approved.  10 Gy in 5 fractions has been prescribed to the 97% isodose line.  -----------------------------------  Eppie Gibson, MD

## 2016-04-12 ENCOUNTER — Encounter: Payer: Self-pay | Admitting: Physician Assistant

## 2016-04-12 ENCOUNTER — Ambulatory Visit (INDEPENDENT_AMBULATORY_CARE_PROVIDER_SITE_OTHER): Payer: 59 | Admitting: Physician Assistant

## 2016-04-12 ENCOUNTER — Ambulatory Visit (INDEPENDENT_AMBULATORY_CARE_PROVIDER_SITE_OTHER): Payer: 59

## 2016-04-12 VITALS — BP 105/70 | HR 92 | Temp 98.3°F | Wt 104.0 lb

## 2016-04-12 DIAGNOSIS — R05 Cough: Secondary | ICD-10-CM

## 2016-04-12 DIAGNOSIS — Z0000 Encounter for general adult medical examination without abnormal findings: Secondary | ICD-10-CM

## 2016-04-12 DIAGNOSIS — R059 Cough, unspecified: Secondary | ICD-10-CM

## 2016-04-12 MED ORDER — PREDNISONE 20 MG PO TABS: 40.0000 mg | ORAL_TABLET | Freq: Every day | ORAL | 0 refills | Status: DC

## 2016-04-12 MED ORDER — AZITHROMYCIN 250 MG PO TABS: ORAL_TABLET | ORAL | 0 refills | Status: DC

## 2016-04-12 NOTE — Patient Instructions (Addendum)
For your cough: - Go downstairs for chest xray - Take antibiotic as prescribed - Take steroid once a day for 5 days - Mucinex (guafenisen): 400 mg ORALLY every 4 hours with at least 8 oz of water - to help break up the mucus and chest congestion - Return if no improvement in 1 week   Cough, Adult Coughing is a reflex that clears your throat and your airways. Coughing helps to heal and protect your lungs. It is normal to cough occasionally, but a cough that happens with other symptoms or lasts a long time may be a sign of a condition that needs treatment. A cough may last only 2-3 weeks (acute), or it may last longer than 8 weeks (chronic). What are the causes? Coughing is commonly caused by:  Breathing in substances that irritate your lungs.  A viral or bacterial respiratory infection.  Allergies.  Asthma.  Postnasal drip.  Smoking.  Acid backing up from the stomach into the esophagus (gastroesophageal reflux).  Certain medicines.  Chronic lung problems, including COPD (or rarely, lung cancer).  Other medical conditions such as heart failure. Follow these instructions at home: Pay attention to any changes in your symptoms. Take these actions to help with your discomfort:  Take medicines only as told by your health care provider.  If you were prescribed an antibiotic medicine, take it as told by your health care provider. Do not stop taking the antibiotic even if you start to feel better.  Talk with your health care provider before you take a cough suppressant medicine.  Drink enough fluid to keep your urine clear or pale yellow.  If the air is dry, use a cold steam vaporizer or humidifier in your bedroom or your home to help loosen secretions.  Avoid anything that causes you to cough at work or at home.  If your cough is worse at night, try sleeping in a semi-upright position.  Avoid cigarette smoke. If you smoke, quit smoking. If you need help quitting, ask your  health care provider.  Avoid caffeine.  Avoid alcohol.  Rest as needed. Contact a health care provider if:  You have new symptoms.  You cough up pus.  Your cough does not get better after 2-3 weeks, or your cough gets worse.  You cannot control your cough with suppressant medicines and you are losing sleep.  You develop pain that is getting worse or pain that is not controlled with pain medicines.  You have a fever.  You have unexplained weight loss.  You have night sweats. Get help right away if:  You cough up blood.  You have difficulty breathing.  Your heartbeat is very fast. This information is not intended to replace advice given to you by your health care provider. Make sure you discuss any questions you have with your health care provider. Document Released: 06/30/2010 Document Revised: 06/09/2015 Document Reviewed: 03/10/2014 Elsevier Interactive Patient Education  2017 [place] Years, Female Preventive care refers to lifestyle choices and visits with your health care provider that can promote health and wellness. What does preventive care include?  A yearly physical exam. This is also called an annual well check.  Dental exams once or twice a year.  Routine eye exams. Ask your health care provider how often you should have your eyes checked.  Personal lifestyle choices, including:  Daily care of your teeth and gums.  Regular physical activity.  Eating a healthy diet.  Avoiding tobacco and  drug use.  Limiting alcohol use.  Practicing safe sex.  Taking low-dose aspirin daily starting at age 64.  Taking vitamin and mineral supplements as recommended by your health care provider. What happens during an annual well check? The services and screenings done by your health care provider during your annual well check will depend on your age, overall health, lifestyle risk factors, and family history of disease. Counseling    Your health care provider may ask you questions about your:  Alcohol use.  Tobacco use.  Drug use.  Emotional well-being.  Home and relationship well-being.  Sexual activity.  Eating habits.  Work and work Statistician.  Method of birth control.  Menstrual cycle.  Pregnancy history. Screening  You may have the following tests or measurements:  Height, weight, and BMI.  Blood pressure.  Lipid and cholesterol levels. These may be checked every 5 years, or more frequently if you are over 72 years old.  Skin check.  Lung cancer screening. You may have this screening every year starting at age 83 if you have a 30-pack-year history of smoking and currently smoke or have quit within the past 15 years.  Fecal occult blood test (FOBT) of the stool. You may have this test every year starting at age 59.  Flexible sigmoidoscopy or colonoscopy. You may have a sigmoidoscopy every 5 years or a colonoscopy every 10 years starting at age 39.  Hepatitis C blood test.  Hepatitis B blood test.  Sexually transmitted disease (STD) testing.  Diabetes screening. This is done by checking your blood sugar (glucose) after you have not eaten for a while (fasting). You may have this done every 1-3 years.  Mammogram. This may be done every 1-2 years. Talk to your health care provider about when you should start having regular mammograms. This may depend on whether you have a family history of breast cancer.  BRCA-related cancer screening. This may be done if you have a family history of breast, ovarian, tubal, or peritoneal cancers.  Pelvic exam and Pap test. This may be done every 3 years starting at age 43. Starting at age 70, this may be done every 5 years if you have a Pap test in combination with an HPV test.  Bone density scan. This is done to screen for osteoporosis. You may have this scan if you are at high risk for osteoporosis. Discuss your test results, treatment options, and if  necessary, the need for more tests with your health care provider. Vaccines  Your health care provider may recommend certain vaccines, such as:  Influenza vaccine. This is recommended every year.  Tetanus, diphtheria, and acellular pertussis (Tdap, Td) vaccine. You may need a Td booster every 10 years.  Varicella vaccine. You may need this if you have not been vaccinated.  Zoster vaccine. You may need this after age 22.  Measles, mumps, and rubella (MMR) vaccine. You may need at least one dose of MMR if you were born in 1957 or later. You may also need a second dose.  Pneumococcal 13-valent conjugate (PCV13) vaccine. You may need this if you have certain conditions and were not previously vaccinated.  Pneumococcal polysaccharide (PPSV23) vaccine. You may need one or two doses if you smoke cigarettes or if you have certain conditions.  Meningococcal vaccine. You may need this if you have certain conditions.  Hepatitis A vaccine. You may need this if you have certain conditions or if you travel or work in places where you may be exposed  to hepatitis A.  Hepatitis B vaccine. You may need this if you have certain conditions or if you travel or work in places where you may be exposed to hepatitis B.  Haemophilus influenzae type b (Hib) vaccine. You may need this if you have certain conditions. Talk to your health care provider about which screenings and vaccines you need and how often you need them. This information is not intended to replace advice given to you by your health care provider. Make sure you discuss any questions you have with your health care provider. Document Released: 01/28/2015 Document Revised: 09/21/2015 Document Reviewed: 11/02/2014 Elsevier Interactive Patient Education  2017 [employer].

## 2016-04-12 NOTE — Progress Notes (Signed)
HPI:                                                                Shelly Smith is a 51 y.o. female who presents to [facility] today to establish care   Current Concerns include cough  Patient with PMH of breast cancer reports persistent cough x 2 weeks. Cough is occasionally mildly productive of clear mucus. She has tried cough drops. Denies hemoptysis, wheezing, dyspnea. Denies fever, chills, nightsweats, or weight loss. Nonsmoker. Denies history of asthma or pulmonary disease. Endorses history of bronchitis.   Health Maintenance Health Maintenance  Topic Date Due  . HIV Screening  11/18/1980  . TETANUS/TDAP  11/18/1984  . Fecal DNA (Cologuard)  11/19/2015  . INFLUENZA VACCINE  08/15/2016  . MAMMOGRAM  10/12/2017  . PAP SMEAR  11/18/2017    GYN/Sexual Health  Menstrual status: having menstrual periods  LMP: 04/02/16  Menses: irregular   Last pap smear: 1.5 years ago  History of abnormal pap smears: no  Sexually active: yes, female partner  Current contraception: none   Past Medical History:  Diagnosis Date  . Breast cancer of lower-inner quadrant of right female breast ([facility]) 10/10/2015  . History of bronchitis   . History of radiation therapy 12/27/15- 02/08/16   Right Chest Wall, Axilla, SCV treated to 50 Gy in 25 fractions, and IM nodes to >45 Gy in 25 fractions, and then chest wall scar boosted an additional 10 Gy in 5 fractions.   Past Surgical History:  Procedure Laterality Date  . MASTECTOMY W/ SENTINEL NODE BIOPSY Right 11/21/2015   Procedure: RIGHT TOTAL MASTECTOMY WITH RIGHT AXILLARY SENTINEL LYMPH NODE BIOPSY;  Surgeon: Rolm Bookbinder, MD;  Location: [facility];  Service: General;  Laterality: Right;   Social History  Substance Use Topics  . Smoking status: Never Smoker  . Smokeless tobacco: Never Used  . Alcohol use Yes     Comment: social drinker    family history includes Cancer in her cousin;  Hypertension in her father and mother.  ROS: negative except as noted in the HPI  Medications: Current Outpatient Prescriptions  Medication Sig Dispense Refill  . azithromycin (ZITHROMAX Z-PAK) 250 MG tablet Take 2 tablets (500 mg) on  Day 1,  followed by 1 tablet (250 mg) once daily on Days 2 through 5. 6 tablet 0  . predniSONE (DELTASONE) 20 MG tablet Take 2 tablets (40 mg total) by mouth daily with breakfast. 10 tablet 0  . tamoxifen (NOLVADEX) 20 MG tablet TAKE 1 TABLET BY MOUTH EVERY DAY 30 tablet 3   No current facility-administered medications for this visit.    Allergies  Allergen Reactions  . No Known Allergies        Objective:  BP 105/70   Pulse 92   Temp 98.3 F (36.8 C) (Oral)   Wt 104 lb (47.2 kg)   BMI 17.85 kg/m  Gen: well-groomed, cooperative, not ill-appearing, no distress HEENT: normal conjunctiva, TM's clear, oropharynx clear, moist mucus membranes, no thyromegaly or tenderness Pulm: Normal work of breathing, normal phonation, clear to auscultation bilaterally, occasional dry cough with inspiration, no wheezes, rales or rhonchi CV: Normal rate, regular rhythm, s1 and s2 distinct, no murmurs, clicks or rubs, no carotid  bruit GI: abdomen soft, nondistended, nontender, no masses Neuro: alert and oriented x 3, EOM's intact, PERRLA, DTR's intact, normal tone, no tremor MSK: moving all extremities, normal gait and station, no peripheral edema Skin: warm and dry, no rashes or lesions on exposed skin Psych: normal affect, euthymic mood, normal speech and thought content   No results found for this or any previous visit (from the past 72 hour(s)). No results found.    Assessment and Plan: 51 y.o. female with   1. Cough - likely bronchitis or post-viral cough syndrome. No adventitious lung sounds appreciated on exam. No systemic symptoms of infection. Obtaining chest xray. Will cover for CAP given duration of symptoms - DG Chest 2 View - azithromycin  (ZITHROMAX Z-PAK) 250 MG tablet; Take 2 tablets (500 mg) on  Day 1,  followed by 1 tablet (250 mg) once daily on Days 2 through 5.  Dispense: 6 tablet; Refill: 0 - predniSONE (DELTASONE) 20 MG tablet; Take 2 tablets (40 mg total) by mouth daily with breakfast.  Dispense: 10 tablet; Refill: 0  2. Encounter for preventive health examination - Lipid Panel w/reflex Direct LDL - CBC with Differential/Platelet - Comprehensive metabolic panel - Cologuard ordered for colon cancer screening - Pap UTD per patient  Patient education and anticipatory guidance given Patient agrees with treatment plan Follow-up in 1 year or sooner as needed  Darlyne Russian PA-C

## 2016-04-13 LAB — COMPREHENSIVE METABOLIC PANEL
ALT: 16 U/L (ref 6–29)
AST: 20 U/L (ref 10–35)
Albumin: 3.5 g/dL — ABNORMAL LOW (ref 3.6–5.1)
Alkaline Phosphatase: 41 U/L (ref 33–130)
BUN: 12 mg/dL (ref 7–25)
CALCIUM: 8.1 mg/dL — AB (ref 8.6–10.4)
CO2: 25 mmol/L (ref 20–31)
Chloride: 105 mmol/L (ref 98–110)
Creat: 0.65 mg/dL (ref 0.50–1.05)
GLUCOSE: 97 mg/dL (ref 65–99)
POTASSIUM: 3.8 mmol/L (ref 3.5–5.3)
Sodium: 137 mmol/L (ref 135–146)
Total Bilirubin: 0.3 mg/dL (ref 0.2–1.2)
Total Protein: 6.4 g/dL (ref 6.1–8.1)

## 2016-04-13 LAB — CBC WITH DIFFERENTIAL/PLATELET
BASOS PCT: 0 %
Basophils Absolute: 0 cells/uL (ref 0–200)
Eosinophils Absolute: 53 cells/uL (ref 15–500)
Eosinophils Relative: 1 %
HCT: 37 % (ref 35.0–45.0)
Hemoglobin: 12.4 g/dL (ref 11.7–15.5)
LYMPHS PCT: 16 %
Lymphs Abs: 848 cells/uL — ABNORMAL LOW (ref 850–3900)
MCH: 33.2 pg — ABNORMAL HIGH (ref 27.0–33.0)
MCHC: 33.5 g/dL (ref 32.0–36.0)
MCV: 99.2 fL (ref 80.0–100.0)
MONO ABS: 318 {cells}/uL (ref 200–950)
MONOS PCT: 6 %
MPV: 8.6 fL (ref 7.5–12.5)
NEUTROS PCT: 77 %
Neutro Abs: 4081 cells/uL (ref 1500–7800)
PLATELETS: 213 10*3/uL (ref 140–400)
RBC: 3.73 MIL/uL — AB (ref 3.80–5.10)
RDW: 12.6 % (ref 11.0–15.0)
WBC: 5.3 10*3/uL (ref 3.8–10.8)

## 2016-04-13 LAB — LIPID PANEL W/REFLEX DIRECT LDL
CHOL/HDL RATIO: 3.9 ratio (ref <5.0)
Cholesterol: 141 mg/dL (ref <200)
HDL: 36 mg/dL — AB (ref >50)
LDL-CHOLESTEROL: 75 mg/dL
NON-HDL CHOLESTEROL (CALC): 105 mg/dL (ref <130)
Triglycerides: 199 mg/dL — ABNORMAL HIGH (ref <150)

## 2016-04-17 DIAGNOSIS — R059 Cough, unspecified: Secondary | ICD-10-CM | POA: Insufficient documentation

## 2016-04-17 DIAGNOSIS — R05 Cough: Secondary | ICD-10-CM | POA: Insufficient documentation

## 2016-05-01 ENCOUNTER — Encounter: Payer: Self-pay | Admitting: Physician Assistant

## 2016-05-01 DIAGNOSIS — Z1212 Encounter for screening for malignant neoplasm of rectum: Secondary | ICD-10-CM | POA: Diagnosis not present

## 2016-05-01 DIAGNOSIS — Z1211 Encounter for screening for malignant neoplasm of colon: Secondary | ICD-10-CM | POA: Diagnosis not present

## 2016-05-09 LAB — COLOGUARD

## 2016-05-10 ENCOUNTER — Telehealth: Payer: Self-pay

## 2016-05-10 NOTE — Telephone Encounter (Signed)
-----   Message from [facility] [place] sent at 05/10/2016  7:57 AM EDT ----- Regarding: Cologuard Good news - Cologuard was negative Recommend repeat Cologuard in 3 years

## 2016-05-10 NOTE — Telephone Encounter (Signed)
Pt notified of results -EH/RMA   

## 2016-05-11 ENCOUNTER — Encounter: Payer: 59 | Admitting: Adult Health

## 2016-05-11 ENCOUNTER — Encounter: Payer: Self-pay | Admitting: Hematology

## 2016-05-11 NOTE — Progress Notes (Signed)
Left message on voice mail advising patient due to provider emergency  today's appt will be cancel and the scheduling department will call her with new appt date and time.

## 2016-06-07 NOTE — Progress Notes (Signed)
[place]  Telephone:([phone]) [phone] Fax:(336) [phone]  Clinic follow up Note   Patient Care Team: Trixie Dredge, PA-C as PCP - General (Physician Assistant) Kyung Rudd, MD as Consulting Physician (Radiation Oncology) Truitt Merle, MD as Consulting Physician (Hematology) Erroll Luna, MD as Consulting Physician (General Surgery) Gardenia Phlegm, NP as Nurse Practitioner (Hematology and Oncology) 06/13/2016  CHIEF COMPLAINT:  Follow up right breast cancer  Oncology History   Breast cancer of lower-inner quadrant of right female breast [facility])   Staging form: Breast, AJCC 7th Edition   - Clinical stage from 10/04/2015: Stage IA (T1c, N0, M0) - Signed by Truitt Merle, MD on 10/12/2015   - Pathologic stage from 11/21/2015: Stage IIA (T1c, N1a, cM0) - Signed by Truitt Merle, MD on 12/12/2015      Breast cancer of lower-inner quadrant of right female breast ([place])   10/04/2015 Mammogram    Diagnostic mammogram and US showed a 1.6cm lobulated irregular high density mass in the lower inner quadrant right breast, possible chest wall invasion and skin involvement, right axillar [place] (-)      10/05/2015 Initial Diagnosis    Breast cancer of lower-inner quadrant of right female breast ([place])      10/05/2015 Initial Biopsy    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       10/05/2015 Receptors her2    ER 90% +, PR 95%+, strong staining, Her2-, Ki67 10%      10/05/2015 Oncotype testing    Oncotype recurrence score of 19, intermediate risk, predicts 10 year distant recurrence risk of 12% with tamoxifen.      10/13/2015 Imaging    Bilateral breast MRI with and without contrast showed the known 1.7 x 1.7 x 1.0 cm enhancing mass, and a 2.0 x 1.4 x 3.6 cm area of non-mass enhancement. Left breast was negative. Lymph nodes appear normal.      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 Donne Hazel       11/21/2015 Pathology Results    Right breast mastectomy showed 1.6cm invasive ductal carcinoma, G1, (+) LVI, (+) perineural invasion, one SLN was positive, margins were negative       12/27/2015 - 02/08/2016 Radiation Therapy    Adjuvant radiation to right Chest Wall, Axilla, SCV treated to 50 Gy in 25 fractions, and IM nodes to >45 Gy in 25 fractions, and then chest wall scar boosted an additional 10 Gy in 5 fractions.       HISTORY OF PRESENTING ILLNESS:  Shelly Smith 51 y.o. female is here because of Her recently diagnosed right breast cancer. She is accompanied by her husband to my clinic today.  She noticed a small right breast nodule below the nipple in the summer 2016, no nipple discharge, skin change, pain or other symptoms. She saw her gynecologist a few months later, and breast exam did confirm the breast nodule. Mammogram was ordered, but was not scheduled. She noticed some in skin retraction in the area in earlier this year. She finally scheduled for her mammogram a few weeks ago, which showed a 1.6 cm lobulated mass in the right breast lower inner quadrant. She underwent ultrasound-guided  core needle biopsy the next day, which showed invasive lobular carcinoma, ER/PR strongly positive, HER-2 negative. She was referred to [place] for further management.  She feels well overall. No pain, or other discomfort. She denies any weight loss, change of appetite or energy level lately. She is to have mammograms every other year, no family history of breast cancer or personal history of breast disease or biopsy. She is married, lives with her husband, the relocated from [place] to [place] last spring. She has 2 adult daughters. She is oriented in from [place].   GYN HISTORY  Menarchal:  13 LMP: 08/2015 Contraceptive: no  HRT: no  G2P2: two daughters 57 and 39 yo   CURRENT THERAPY: Tamoxifen 20 mg once daily, started on 10/13/2015  INTERIM HISTORY:  Juliann returns for follow-up. She presents to the clinic today. She reports to losing weight for this time of year. She reports to doing good and having no trouble with Tamoxifen, She does not get her period, her last one was 1 month after surgery. Her mood is pretty good and does not get hot flashes. She reports her white counts and platelets were low previously from a cough she had. She has yet to find a PCP but is still seen by PA. She would have to call monthly. She would like to get her lyme disease checked. She had a pap smear this month. She had a colon guard. So she is up to date on her cancer screening. Swelling and pulling under her right arm. She did have PT and used a sleeve but it was too tight for her and her hands swells from it and she was scared about that. She has not done a self breast exam but will from now on. She found dead Tick on her body that was there for a couple of days. She was not able to do Survivorship because it was cancelled. She feels she is fine without it now. She does not plan to do reconstruction. She made her own prosthetic bra.    MEDICAL HISTORY:  Past Medical History:  Diagnosis Date  . Breast cancer of lower-inner quadrant of right female breast ([place]) 10/10/2015  . History of bronchitis   . History of radiation therapy 12/27/15- 02/08/16   Right Chest Wall, Axilla, SCV treated to 50 Gy in 25 fractions, and IM nodes to >45 Gy in 25 fractions, and then chest wall scar boosted an additional 10 Gy in 5 fractions.    SURGICAL HISTORY: Past Surgical History:  Procedure Laterality Date  . MASTECTOMY W/ SENTINEL NODE BIOPSY Right 11/21/2015   Procedure: RIGHT TOTAL MASTECTOMY WITH RIGHT AXILLARY SENTINEL LYMPH NODE BIOPSY;  Surgeon: Rolm Bookbinder, MD;  Location: [facility];  Service: General;   Laterality: Right;    SOCIAL HISTORY: Social History   Social History  . Marital status: Married    Spouse name: N/A  . Number of children: N/A  . Years of education: N/A   Occupational History  . Not on file.   Social History Main Topics  . Smoking status: Never Smoker  . Smokeless tobacco: Never Used  . Alcohol use Yes     Comment: social drinker   . Drug use: No  . Sexual activity: Yes   Other Topics Concern  . Not on file   Social History Narrative  . No narrative on file    FAMILY HISTORY: Family History  Problem Relation Age of Onset  . Hypertension Mother   . Hypertension  Father   . Cancer Cousin        brain tumor     ALLERGIES:  is allergic to no known allergies.  MEDICATIONS:  Current Outpatient Prescriptions  Medication Sig Dispense Refill  . tamoxifen (NOLVADEX) 20 MG tablet Take 1 tablet (20 mg total) by mouth daily. 90 tablet 1   No current facility-administered medications for this visit.     REVIEW OF SYSTEMS:   Constitutional: Denies fevers, chills or abnormal night sweats (+) spotting Eyes: Denies blurriness of vision, double vision or watery eyes Ears, nose, mouth, throat, and face: Denies mucositis or sore throat Respiratory: Denies cough, dyspnea or wheezes Cardiovascular: Denies palpitation, chest discomfort or lower extremity swelling Gastrointestinal:  Denies nausea, heartburn or change in bowel habits Skin: Denies abnormal skin rashes Lymphatics: Denies new lymphadenopathy or easy bruising (+) occasional swelling and pulling in right axillary.  Neurological:Denies numbness, tingling or new weaknesses Behavioral/Psych: Mood is stable, no new changes  Musculoskeletal: (+) upper right arm sensitivity from surgery  All other systems were reviewed with the patient and are negative.  PHYSICAL EXAMINATION: ECOG PERFORMANCE STATUS: 0 - Asymptomatic  Vitals:   06/13/16 0929  BP: 107/72  Pulse: 73  Resp: 20  Temp: 98.1 F (36.7 C)    Filed Weights   06/13/16 0929  Weight: 95 lb 8 oz (43.3 kg)     GENERAL:alert, no distress and comfortable SKIN: skin color, texture, turgor are normal, no rashes or significant lesions  EYES: normal, conjunctiva are pink and non-injected, sclera clear OROPHARYNX:no exudate, no erythema and lips, buccal mucosa, and tongue normal  NECK: supple, thyroid normal size, non-tender, without nodularity LYMPH:  no palpable lymphadenopathy in the cervical, axillary or inguinal LUNGS: clear to auscultation and percussion with normal breathing effort HEART: regular rate & rhythm and no murmurs and no lower extremity edema ABDOMEN:abdomen soft, non-tender and normal bowel sounds, no hepatomegaly  Musculoskeletal:no cyanosis of digits and no clubbing (+) tightness around right axillary tendon   PSYCH: alert & oriented x 3 with fluent speech NEURO: no focal motor/sensory deficits Breasts: Breast inspection showed Right breast is surgically absent, incision has well healed. Some mild skin pigmentation changes from radiation. Exam of left breast and bilateral axillas showed no palpable mass or adenopathy.slight right  axillary swelling   LABORATORY DATA:  I have reviewed the data as listed CBC Latest Ref Rng & Units 06/13/2016 04/12/2016 03/13/2016  WBC 3.9 - 10.3 10e3/uL 3.0(L) 5.3 3.5(L)  Hemoglobin 11.6 - 15.9 g/dL 13.7 12.4 14.0  Hematocrit 34.8 - 46.6 % 39.6 37.0 39.8  Platelets 145 - 400 10e3/uL 121(L) 213 139(L)   CMP Latest Ref Rng & Units 06/13/2016 04/12/2016 03/13/2016  Glucose 70 - 140 mg/dl 106 97 117  BUN 7.0 - 26.0 mg/dL 12.4 12 14.7  Creatinine 0.6 - 1.1 mg/dL 0.8 0.65 0.7  Sodium 136 - 145 mEq/L 141 137 141  Potassium 3.5 - 5.1 mEq/L 3.9 3.8 3.7  Chloride 98 - 110 mmol/L - 105 -  CO2 22 - 29 mEq/L _0 Calcium 8.4 - 10.4 mg/dL 9.0 8.1(L) 9.2  Total Protein 6.4 - 8.3 g/dL 6.8 6.4 7.0  Total Bilirubin 0.20 - 1.20 mg/dL 0.30 0.3 0.46  Alkaline Phos 40 - 150 U/L 56 41 49  AST 5  - 34 U/L _1 ALT 0 - 55 U/L _2 PATHOLOGY REPORT  Diagnosis 11/02/2015 Breast, right, needle core biopsy, lateral - ATYPICAL DUCTAL HYPERPLASIA. -  PSEUDOANGIOMATOUS STROMAL HYPERPLASIA (PASH) - FIBROCYSTIC CHANGES. - SEE COMMENT. Microscopic Comment There is a small focus of atypical ductal hyperplasia, supported by a positive stain for E-Cadherin and a negative stain for cytokeratin 5/6. The results were called to the [facility] on 11/03/2015  Diagnosis 10/05/2015 Breast, right, needle core biopsy INVASIVE DUCTAL CARCINOMA, GRADE 2 Results: IMMUNOHISTOCHEMICAL AND MORPHOMETRIC ANALYSIS PERFORMED MANUALLY Estrogen Receptor: 90%, POSITIVE, STRONG STAINING INTENSITY Progesterone Receptor: 95%, POSITIVE, STRONG STAINING INTENSITY Proliferation Marker Ki67: 10% Results: HER2 - NEGATIVE RATIO OF HER2/CEP17 SIGNALS 1.42 AVERAGE HER2 COPY NUMBER PER CELL 2.20  ONCOTYPE DX RS 19, predicts 10 year distance recurrence risk of 12% with tamoxifen   Diagnosis 11/21/2015 1. Breast, simple mastectomy, Right - INVASIVE DUCTAL CARCINOMA GRADE I/III, SPANNING 1.6 CM. - DUCTAL CARICNOMA IN SITU, INTERMEDIATE GRADE. - LYMPHOVASCULAR INVASION IS IDENTIFIED. - PERINEURAL INVASION IS IDENTIFIED. - INVASIVE CARCINOMA IS FOCALLY 0.1 CM TO THE ANTERIOR MARGIN. - SEE ONCOLOGY TABLE BELOW. 2. Lymph node, sentinel, biopsy, Right Axillary - METASTATIC CARCINOMA IN 1 OF 1 LYMPH NODE (1/1). Microscopic Comment 1. BREAST, INVASIVE TUMOR, WITH LYMPH NODES PRESENT Specimen, including laterality and lymph node sampling (sentinel, non-sentinel): Right breast and right axillary lymph node. Procedure: Simple mastectomy and one lymph node resection. Histologic type: Ductal Grade: I Tubule formation: 2 Nuclear pleomorphism: 2 Mitotic:1 Tumor size (gross measurement): 1.6 cm Margins: Invasive, distance to closest margin: Focally 0.1 cm to the anterior margin. Lymphovascular  invasion: Present. Ductal carcinoma in situ: Present. Grade: Intermediate. Extensive intraductal component: Not identified. Lobular neoplasia: Not identified. Tumor focality: Unifocal Extent of tumor: Confined to breast parenchyma. Lymph nodes: 1 of 3 FINAL for VOLA, BENEKE ([id number]) Microscopic Comment(continued) Examined: 1 Sentinel 0 Non-sentinel 1 Total Lymph nodes with metastasis: 1 Isolated tumor cells (< 0.2 mm): 0 Micrometastasis: (> 0.2 mm and < 2.0 mm): 0 Macrometastasis: (> 2.0 mm): 1 Extracapsular extension: Not identified. Breast prognostic profile: Case [id number] Estrogen receptor: 90%, strong Progesterone receptor: 95%, strong. Her 2 neu: No amplification was detected. The ratio was 1.42. Ki-67: 10% Non-neoplastic breast: Fibrocystic changes, pseudoangiomatous stromal hyperplasia (PASH), and healing biopsy sites. TNM: pT1c, pN1a Comments: There are two lesions containing biopsy clips. The 1.6 cm lesion in the mid/inferior lateral specimen reveals grade I invasive ductal carcinoma. This tumor is focally 0.1 cm to the anterior margin. In addition, there is a 0.8 cm vaguely nodular lesion present lateral to this larger mass, containing a dumbbell shaped clip. Histologic evaluation of this area reveals benign fibrocystic changes and pseudoangiomatous stromal hyperplasia with a healing biopsy site. No carcinoma is identified here. (JBK:gt, 11/22/15)  RADIOGRAPHIC STUDIES: I have personally reviewed the radiological images as listed and agreed with the findings in the report.  MRI Breast b/l 10/13/2015 IMPRESSION: Irregular enhancing mass within the lower outer right breast compatible with recently biopsied right breast carcinoma. There a few small spicules of this mass extending posteriorly toward the pectoralis muscle. No definitive pectoralis muscle enhancement to suggest invasion identified on current examination.  Within the lateral aspect of the right  breast there is a 3.6 cm area of suspicious non mass enhancement.  ASSESSMENT & PLAN:  51 y.o. premenopausal [place] woman, presented with a palpable right breast mass for one year.  1. Breast cancer of the lower inner quadrant of right female breast, pT1cN1aM0, stage IIA, ER/PR strongly positive, HER-2 negative, grade 2, Oncotype RS 19 --We previously discussed her surgical pathology findings with patient in details. -She had a complete surgical resection  with negative margins, unfortunately the 1 sentinel lymph nodes was positive.  -the Oncotype Dx was done on her initial biopsy before suregery, the result was reviewed with her in details. She has intermedia risk based on the recurrence score, which predicts 10 year distant recurrence after 5 years of tamoxifen 12%. The benefit of chemotherapy in the intermedia risk group is small and controversial. Given her relatively low score in the intermedia group, I did not recommend adjuvant chemotherapy. She agrees with the plan -Her surgical path showed low grade tumor, low KI67, I do not think she needs mammaprint, which will likely be low risk disease if we did  -She started tamoxifen before surgery, has been tolerating well, without significant side effects. We'll continue for 10 years. The alternative option of switching her to aromatase inhibitor after she became postmenopause was also discussed with her. -She has completed adjuvant chest wall and asked her radiation, tolerated well. -She is clinically doing well, lab results reviewed with her, exam was unremarkable, no clinical concern for recurrence. -She does not plan to do a breast reconstruction. She does not want a breast prosthesis.  -labs reviewed and has slightly low white count and platelet count. We will monitor. Kidney liver functions and normal  -Continue breast cancer surveillance -Due for mammogram in September at [facility] see her in 4 months then every 6 months after that.  -I  encouraged her to continue healthy diet and exercise rectally. She is very physically active. -She will continue follow-up with her primary care physician once a year for physical, including cholesterol, glucose test   2. Mild leukopenia and thrombocytopenia -Started after tamoxifen, not sure if it's related -Overall mild, we'll continue monitoring.  3. Tick bite -She is tick bite last week, I checked Lyme disease antibody today, results still pending. I'll call her when the results back.  Plan -Continue tamoxifen -Order M[facility] due in September 2018 at [facility] and follow-up in 4 months, then every 6 months after next visit   All questions were answered. The patient knows to call the clinic with any problems, questions or concerns.  I spent 20 minutes counseling the patient face to face. The total time spent in the appointment was 25 minutes and more than 50% was on counseling.  This document serves as a record of services personally performed by Truitt Merle, MD. It was created on her behalf by Joslyn Devon, a trained medical scribe. The creation of this record is based on the scribe's personal observations and the provider's statements to them. This document has been checked and approved by the attending provider.   I have reviewed the above documentation for accuracy and completeness and I agree with the above.  Truitt Merle  06/13/2016

## 2016-06-13 ENCOUNTER — Ambulatory Visit (HOSPITAL_BASED_OUTPATIENT_CLINIC_OR_DEPARTMENT_OTHER): Payer: 59 | Admitting: Hematology

## 2016-06-13 ENCOUNTER — Other Ambulatory Visit (HOSPITAL_BASED_OUTPATIENT_CLINIC_OR_DEPARTMENT_OTHER): Payer: 59

## 2016-06-13 ENCOUNTER — Encounter: Payer: Self-pay | Admitting: Hematology

## 2016-06-13 VITALS — BP 107/72 | HR 73 | Temp 98.1°F | Resp 20 | Ht 64.0 in | Wt 95.5 lb

## 2016-06-13 DIAGNOSIS — D72819 Decreased white blood cell count, unspecified: Secondary | ICD-10-CM | POA: Diagnosis not present

## 2016-06-13 DIAGNOSIS — W57XXXA Bitten or stung by nonvenomous insect and other nonvenomous arthropods, initial encounter: Secondary | ICD-10-CM

## 2016-06-13 DIAGNOSIS — Z170 Estrogen receptor positive status [ER+]: Secondary | ICD-10-CM

## 2016-06-13 DIAGNOSIS — Z79811 Long term (current) use of aromatase inhibitors: Secondary | ICD-10-CM | POA: Diagnosis not present

## 2016-06-13 DIAGNOSIS — Z7689 Persons encountering health services in other specified circumstances: Secondary | ICD-10-CM | POA: Diagnosis not present

## 2016-06-13 DIAGNOSIS — D696 Thrombocytopenia, unspecified: Secondary | ICD-10-CM

## 2016-06-13 DIAGNOSIS — C50311 Malignant neoplasm of lower-inner quadrant of right female breast: Secondary | ICD-10-CM

## 2016-06-13 LAB — CBC WITH DIFFERENTIAL/PLATELET
BASO%: 0.3 % (ref 0.0–2.0)
BASOS ABS: 0 10*3/uL (ref 0.0–0.1)
EOS%: 2.3 % (ref 0.0–7.0)
Eosinophils Absolute: 0.1 10*3/uL (ref 0.0–0.5)
HEMATOCRIT: 39.6 % (ref 34.8–46.6)
HGB: 13.7 g/dL (ref 11.6–15.9)
LYMPH#: 1 10*3/uL (ref 0.9–3.3)
LYMPH%: 32.2 % (ref 14.0–49.7)
MCH: 33.4 pg (ref 25.1–34.0)
MCHC: 34.6 g/dL (ref 31.5–36.0)
MCV: 96.6 fL (ref 79.5–101.0)
MONO#: 0.2 10*3/uL (ref 0.1–0.9)
MONO%: 4.9 % (ref 0.0–14.0)
NEUT#: 1.8 10*3/uL (ref 1.5–6.5)
NEUT%: 60.3 % (ref 38.4–76.8)
Platelets: 121 10*3/uL — ABNORMAL LOW (ref 145–400)
RBC: 4.1 10*6/uL (ref 3.70–5.45)
RDW: 12.4 % (ref 11.2–14.5)
WBC: 3 10*3/uL — ABNORMAL LOW (ref 3.9–10.3)

## 2016-06-13 LAB — COMPREHENSIVE METABOLIC PANEL
ALT: 22 U/L (ref 0–55)
ANION GAP: 9 meq/L (ref 3–11)
AST: 24 U/L (ref 5–34)
Albumin: 3.8 g/dL (ref 3.5–5.0)
Alkaline Phosphatase: 56 U/L (ref 40–150)
BUN: 12.4 mg/dL (ref 7.0–26.0)
CALCIUM: 9 mg/dL (ref 8.4–10.4)
CHLORIDE: 108 meq/L (ref 98–109)
CO2: 24 mEq/L (ref 22–29)
Creatinine: 0.8 mg/dL (ref 0.6–1.1)
EGFR: >90 mL/min/{1.73_m2} (ref >90)
Glucose: 106 mg/dl (ref 70–140)
POTASSIUM: 3.9 meq/L (ref 3.5–5.1)
Sodium: 141 mEq/L (ref 136–145)
Total Bilirubin: 0.3 mg/dL (ref 0.20–1.20)
Total Protein: 6.8 g/dL (ref 6.4–8.3)

## 2016-06-13 MED ORDER — TAMOXIFEN CITRATE 20 MG PO TABS: 20.0000 mg | ORAL_TABLET | Freq: Every day | ORAL | 1 refills | Status: DC

## 2016-06-14 LAB — B. BURGDORFI ANTIBODIES: Lyme IgG/IgM Ab: <0.91 {ISR} (ref 0.00–0.90)

## 2016-06-15 ENCOUNTER — Telehealth: Payer: Self-pay | Admitting: Hematology

## 2016-06-15 NOTE — Telephone Encounter (Signed)
Patient bypassed scheduling on 06/13/16. Lab and follow up appointments scheduled and confirmed with patient, per 06/13/16 los. Per patient, she will [facility] to schedule her annual mammogram.

## 2016-07-05 ENCOUNTER — Other Ambulatory Visit: Payer: Self-pay | Admitting: Hematology

## 2016-07-05 DIAGNOSIS — C50311 Malignant neoplasm of lower-inner quadrant of right female breast: Secondary | ICD-10-CM

## 2016-07-05 DIAGNOSIS — Z170 Estrogen receptor positive status [ER+]: Principal | ICD-10-CM

## 2016-10-01 NOTE — Progress Notes (Signed)
[place]  [phone] Fax:([phone]) [phone]  Clinic follow up Note   Patient Care Team: Trixie Dredge, PA-C as PCP - General (Physician Assistant) Kyung Rudd, MD as Consulting Physician (Radiation Oncology) Truitt Merle, MD as Consulting Physician (Hematology) Erroll Luna, MD as Consulting Physician (General Surgery) Gardenia Phlegm, NP as Nurse Practitioner (Hematology and Oncology) 10/05/2016  CHIEF COMPLAINT:  Follow up right breast cancer  Oncology History   Breast cancer of lower-inner quadrant of right female breast [facility])   Staging form: Breast, AJCC 7th Edition   - Clinical stage from 10/04/2015: Stage IA (T1c, N0, M0) - Signed by Truitt Merle, MD on 10/12/2015   - Pathologic stage from 11/21/2015: Stage IIA (T1c, N1a, cM0) - Signed by Truitt Merle, MD on 12/12/2015      Breast cancer of lower-inner quadrant of right female breast ([place])   10/04/2015 Mammogram    Diagnostic mammogram and US showed a 1.6cm lobulated irregular high density mass in the lower inner quadrant right breast, possible chest wall invasion and skin involvement, right axillar [place] (-)      10/05/2015 Initial Diagnosis    Breast cancer of lower-inner quadrant of right female breast ([place])      10/05/2015 Initial Biopsy    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       10/05/2015 Receptors her2    ER 90% +, PR 95%+, strong staining, Her2-, Ki67 10%      10/05/2015 Oncotype testing    Oncotype recurrence score of 19, intermediate risk, predicts 10 year distant recurrence risk of 12% with tamoxifen.      10/13/2015 Imaging    Bilateral breast MRI with and without contrast showed the known 1.7 x 1.7 x 1.0 cm enhancing mass, and a 2.0 x 1.4 x 3.6 cm area of non-mass enhancement. Left breast was negative. Lymph nodes appear normal.      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 Donne Hazel       11/21/2015 Pathology Results    Right breast mastectomy showed 1.6cm invasive ductal carcinoma, G1, (+) LVI, (+) perineural invasion, one SLN was positive, margins were negative       12/27/2015 - 02/08/2016 Radiation Therapy    Adjuvant radiation to right Chest Wall, Axilla, SCV treated to 50 Gy in 25 fractions, and IM nodes to >45 Gy in 25 fractions, and then chest wall scar boosted an additional 10 Gy in 5 fractions.       HISTORY OF PRESENTING ILLNESS:  Shelly Smith 51 y.o. female is here because of Her recently diagnosed right breast cancer. She is accompanied by her husband to my clinic today.  She noticed a small right breast nodule below the nipple in the summer 2016, no nipple discharge, skin change, pain or other symptoms. She saw her gynecologist a few months later, and breast exam did confirm the breast nodule. Mammogram was ordered, but was not scheduled. She noticed some in skin retraction in the area in earlier this year. She finally scheduled for her mammogram a few weeks ago, which showed a 1.6 cm lobulated mass in the right breast lower inner quadrant. She underwent ultrasound-guided  core needle biopsy the next day, which showed invasive lobular carcinoma, ER/PR strongly positive, HER-2 negative. She was referred to [place] for further management.  She feels well overall. No pain, or other discomfort. She denies any weight loss, change of appetite or energy level lately. She is to have mammograms every other year, no family history of breast cancer or personal history of breast disease or biopsy. She is married, lives with her husband, the relocated from [place] to [place] last spring. She has 2 adult daughters. She is oriented in from [place].   GYN HISTORY  Menarchal:  13 LMP: 08/2015 Contraceptive: no  HRT: no  G2P2: two daughters 24 and 22 yo   CURRENT THERAPY: Tamoxifen 20 mg once daily, started on 10/13/2015  INTERIM HISTORY:  [name] returns for follow-up. She presents to the clinic today noting her previously rash is related to tamoxifen. She no longer has a rash.  She notes there was a particular bottle of Tamoxifen that she had that brought about the rash and her hot flashes. Her hot flashes are overall gone if not mild. Her joint stiffness is fine.  She had mammogram at [facility] yesterday. She got the reports from email that says she has dense breast tissue.  She will go traveling to [place] next week.  She notes her low platelet count has been there long before her breast cancer diagnosis. She notes she had a physical with PCP and lab done elsewhere that showed much different results 5 moths ago. She will go to [place] for labs and visit before returning to her PCP in the spring.   She reports to still paying off her medical bills. And her last refill is in October. She will think about the MRI after speaking with her insurance company.     MEDICAL HISTORY:  Past Medical History:  Diagnosis Date  . Breast cancer of lower-inner quadrant of right female breast ([place]) 10/10/2015  . History of bronchitis   . History of radiation therapy 12/27/15- 02/08/16   Right Chest Wall, Axilla, SCV treated to 50 Gy in 25 fractions, and IM nodes to >45 Gy in 25 fractions, and then chest wall scar boosted an additional 10 Gy in 5 fractions.    SURGICAL HISTORY: Past Surgical History:  Procedure Laterality Date  . MASTECTOMY W/ SENTINEL NODE BIOPSY Right 11/21/2015   Procedure: RIGHT TOTAL MASTECTOMY WITH RIGHT AXILLARY SENTINEL LYMPH NODE BIOPSY;  Surgeon: Rolm Bookbinder, MD;  Location: [facility];  Service: General;  Laterality: Right;    SOCIAL HISTORY: Social History   Social History  . Marital status: Married    Spouse name: N/A  . Number of children: N/A  .  Years of education: N/A   Occupational History  . Not on file.   Social History Main Topics  . Smoking status: Never Smoker  . Smokeless tobacco: Never Used  . Alcohol use Yes     Comment: social drinker   . Drug use: No  . Sexual activity: Yes   Other Topics Concern  . Not on file   Social History Narrative  . No narrative on file    FAMILY HISTORY: Family History  Problem Relation Age of Onset  . Hypertension Mother   . Hypertension Father   . Cancer Cousin        brain tumor     ALLERGIES:  is allergic to no known allergies.  MEDICATIONS:  Current Outpatient Prescriptions  Medication Sig Dispense Refill  . tamoxifen (NOLVADEX)  20 MG tablet Take 1 tablet (20 mg total) by mouth daily. 30 tablet 11   No current facility-administered medications for this visit.     REVIEW OF SYSTEMS:   Constitutional: Denies fevers, chills or abnormal night sweats Eyes: Denies blurriness of vision, double vision or watery eyes Ears, nose, mouth, throat, and face: Denies mucositis or sore throat Respiratory: Denies cough, dyspnea or wheezes Cardiovascular: Denies palpitation, chest discomfort or lower extremity swelling Gastrointestinal:  Denies nausea, heartburn or change in bowel habits Skin: Denies abnormal skin rashes Lymphatics: Denies new lymphadenopathy or easy bruising Neurological:Denies numbness, tingling or new weaknesses Behavioral/Psych: Mood is stable, no new changes  Musculoskeletal: Negative All other systems were reviewed with the patient and are negative.  PHYSICAL EXAMINATION: ECOG PERFORMANCE STATUS: 0 - Asymptomatic  Vitals:   10/05/16 0844  BP: 110/67  Pulse: 60  Resp: 18  Temp: 97.7 F (36.5 C)  SpO2: 100%   Filed Weights   10/05/16 0844  Weight: 101 lb 9.6 oz (46.1 kg)     GENERAL:alert, no distress and comfortable SKIN: skin color, texture, turgor are normal, no rashes or significant lesions  EYES: normal, conjunctiva are pink and  non-injected, sclera clear OROPHARYNX:no exudate, no erythema and lips, buccal mucosa, and tongue normal  NECK: supple, thyroid normal size, non-tender, without nodularity LYMPH:  no palpable lymphadenopathy in the cervical, axillary or inguinal LUNGS: clear to auscultation and percussion with normal breathing effort HEART: regular rate & rhythm and no murmurs and no lower extremity edema ABDOMEN:abdomen soft, non-tender and normal bowel sounds, no hepatomegaly  Musculoskeletal:no cyanosis of digits and no clubbing (+) tightness around right axillary tendon   PSYCH: alert & oriented x 3 with fluent speech NEURO: no focal motor/sensory deficits Breasts: Breast inspection showed Right breast is surgically absent, incision has well healed. Some mild skin pigmentation changes from radiation. Exam of left breast and bilateral axillas showed no palpable mass or adenopathy with slight right axillary swelling   LABORATORY DATA:  I have reviewed the data as listed CBC Latest Ref Rng & Units 10/05/2016 06/13/2016 04/12/2016  WBC 3.9 - 10.3 10e3/uL 3.0(L) 3.0(L) 5.3  Hemoglobin 11.6 - 15.9 g/dL 13.5 13.7 12.4  Hematocrit 34.8 - 46.6 % 38.8 39.6 37.0  Platelets 145 - 400 10e3/uL 116(L) 121(L) 213   CMP Latest Ref Rng & Units 10/05/2016 06/13/2016 04/12/2016  Glucose 70 - 140 mg/dl 98 106 97  BUN 7.0 - 26.0 mg/dL 11.3 12.4 12  Creatinine 0.6 - 1.1 mg/dL 0.7 0.8 0.65  Sodium 136 - 145 mEq/L 140 141 137  Potassium 3.5 - 5.1 mEq/L 3.9 3.9 3.8  Chloride 98 - 110 mmol/L - - 105  CO2 22 - 29 mEq/L _0 Calcium 8.4 - 10.4 mg/dL 8.9 9.0 8.1(L)  Total Protein 6.4 - 8.3 g/dL 6.6 6.8 6.4  Total Bilirubin 0.20 - 1.20 mg/dL 0.37 0.30 0.3  Alkaline Phos 40 - 150 U/L 54 56 41  AST 5 - 34 U/L _1 ALT 0 - 55 U/L _2 PATHOLOGY REPORT  Diagnosis 11/02/2015 Breast, right, needle core biopsy, lateral - 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 - SEE COMMENT. Microscopic Comment There is a small focus of atypical ductal hyperplasia, supported by a positive stain for E-Cadherin and a negative stain for cytokeratin 5/6. The results were called to the [facility] on 11/03/2015  Diagnosis 10/05/2015 Breast, right, needle core biopsy  INVASIVE DUCTAL CARCINOMA, GRADE 2 Results: IMMUNOHISTOCHEMICAL AND MORPHOMETRIC ANALYSIS PERFORMED MANUALLY Estrogen Receptor: 90%, POSITIVE, STRONG STAINING INTENSITY Progesterone Receptor: 95%, POSITIVE, STRONG STAINING INTENSITY Proliferation Marker Ki67: 10% Results: HER2 - NEGATIVE RATIO OF HER2/CEP17 SIGNALS 1.42 AVERAGE HER2 COPY NUMBER PER CELL 2.20  ONCOTYPE DX RS 19, predicts 10 year distance recurrence risk of 12% with tamoxifen   Diagnosis 11/21/2015 1. Breast, simple mastectomy, Right - INVASIVE DUCTAL CARCINOMA GRADE I/III, SPANNING 1.6 CM. - DUCTAL CARICNOMA IN SITU, INTERMEDIATE GRADE. - LYMPHOVASCULAR INVASION IS IDENTIFIED. - PERINEURAL INVASION IS IDENTIFIED. - INVASIVE CARCINOMA IS FOCALLY 0.1 CM TO THE ANTERIOR MARGIN. - SEE ONCOLOGY TABLE BELOW. 2. Lymph node, sentinel, biopsy, Right Axillary - METASTATIC CARCINOMA IN 1 OF 1 LYMPH NODE (1/1). Microscopic Comment 1. BREAST, INVASIVE TUMOR, WITH LYMPH NODES PRESENT Specimen, including laterality and lymph node sampling (sentinel, non-sentinel): Right breast and right axillary lymph node. Procedure: Simple mastectomy and one lymph node resection. Histologic type: Ductal Grade: I Tubule formation: 2 Nuclear pleomorphism: 2 Mitotic:1 Tumor size (gross measurement): 1.6 cm Margins: Invasive, distance to closest margin: Focally 0.1 cm to the anterior margin. Lymphovascular invasion: Present. Ductal carcinoma in situ: Present. Grade: Intermediate. Extensive intraductal component: Not identified. Lobular neoplasia: Not identified. Tumor focality: Unifocal Extent of tumor: Confined to breast  parenchyma. Lymph nodes: 1 of 3 FINAL for MYSTIC, LABO ([id number]) Microscopic Comment(continued) Examined: 1 Sentinel 0 Non-sentinel 1 Total Lymph nodes with metastasis: 1 Isolated tumor cells (< 0.2 mm): 0 Micrometastasis: (> 0.2 mm and < 2.0 mm): 0 Macrometastasis: (> 2.0 mm): 1 Extracapsular extension: Not identified. Breast prognostic profile: Case [id number] Estrogen receptor: 90%, strong Progesterone receptor: 95%, strong. Her 2 neu: No amplification was detected. The ratio was 1.42. Ki-67: 10% Non-neoplastic breast: Fibrocystic changes, pseudoangiomatous stromal hyperplasia (PASH), and healing biopsy sites. TNM: pT1c, pN1a Comments: There are two lesions containing biopsy clips. The 1.6 cm lesion in the mid/inferior lateral specimen reveals grade I invasive ductal carcinoma. This tumor is focally 0.1 cm to the anterior margin. In addition, there is a 0.8 cm vaguely nodular lesion present lateral to this larger mass, containing a dumbbell shaped clip. Histologic evaluation of this area reveals benign fibrocystic changes and pseudoangiomatous stromal hyperplasia with a healing biopsy site. No carcinoma is identified here. (JBK:gt, 11/22/15)  RADIOGRAPHIC STUDIES: I have personally reviewed the radiological images as listed and agreed with the findings in the report.  Diagnostic Mammogram Left Breast 10/04/16 at [place] There is no mammographic evidence of malignancy. Routine mammographic evaluation in 1 year is recommended.  This exam was interpreted at the [facility] location.  MRI Breast b/l 10/13/2015 IMPRESSION: Irregular enhancing mass within the lower outer right breast compatible with recently biopsied right breast carcinoma. There a few small spicules of this mass extending posteriorly toward the pectoralis muscle. No definitive pectoralis muscle enhancement to suggest invasion identified on current examination.  Within the lateral aspect of the right  breast there is a 3.6 cm area of suspicious non mass enhancement.  ASSESSMENT & PLAN:  51 y.o. premenopausal [place] woman, presented with a palpable right breast mass for one year.  1. Breast cancer of the lower inner quadrant of right female breast, pT1cN1aM0, stage IIA, ER/PR strongly positive, HER-2 negative, grade 2, Oncotype RS 19 --We previously discussed her surgical pathology findings with patient in details. -She had a complete surgical resection with negative margins, unfortunately the 1 sentinel lymph nodes was positive.  -the Oncotype Dx was done on her initial biopsy before  suregery, the result was reviewed with her in details. She has intermedia risk based on the recurrence score, which predicts 10 year distant recurrence after 5 years of tamoxifen 12%. The benefit of chemotherapy in the intermedia risk group is small and controversial. Given her relatively low score in the intermedia group, I did not recommend adjuvant chemotherapy. She agrees with the plan -Her surgical path showed low grade tumor, low KI67, I do not think she needs mammaprint, which will likely be low risk disease if we did  -She started tamoxifen before surgery, has been tolerating well, without significant side effects. We'll continue for 10 years. The alternative option of switching her to aromatase inhibitor after she became postmenopause was also discussed with her. -She has completed adjuvant chest wall and axillary radiation, tolerated well. -She does not plan to do a breast reconstruction. She does not want a breast prosthesis.  -Continue breast cancer surveillance -I previously encouraged her to continue healthy diet and exercise rectally. She is very physically active. -She will continue follow-up with her primary care physician once a year for physical, including cholesterol, glucose test  -She is clinically doing well, lab results CBC and CMP normal except her mild leukopenia and thrombocytopenia.  Breast exam was unremarkable, Mammogram from 10/04/16 was normal except she has "D" dense breast. No clinical concern for recurrence. -I suggest MRI with her mammograms due to high density breast tissue. Pt will think about MRI due to cost and contact her insurance company.  -Will see her every 6 months. She knows to contact our clinic with any questions or concerns.    2. Mild leukopenia and thrombocytopenia  -started after tamoxifen, intermittent, probably secondary to tamoxifen  -overall stable and mild, we'll continue monitoring. -Will use citrate tube with next labs for platelet levels   3. Skin rash, resolved -She developed some skin rash/urticia one month ago, possible related to generic tamoxifen, resolved after she switched to other generic Tamoxifen   Plan -Continue tamoxifen -Lab and follow-up in 6 months   All questions were answered. The patient knows to call the clinic with any problems, questions or concerns.  I spent 15 minutes counseling the patient face to face. The total time spent in the appointment was 20 minutes and more than 50% was on counseling.  This document serves as a record of services personally performed by Truitt Merle, MD. It was created on her behalf by Joslyn Devon, a trained medical scribe. The creation of this record is based on the scribe's personal observations and the provider's statements to them. This document has been checked and approved by the attending provider.   I have reviewed the above documentation for accuracy and completeness and I agree with the above.  Truitt Merle  10/05/2016

## 2016-10-04 ENCOUNTER — Encounter: Payer: Self-pay | Admitting: Hematology

## 2016-10-04 DIAGNOSIS — R922 Inconclusive mammogram: Secondary | ICD-10-CM | POA: Diagnosis not present

## 2016-10-04 DIAGNOSIS — Z853 Personal history of malignant neoplasm of breast: Secondary | ICD-10-CM | POA: Diagnosis not present

## 2016-10-05 ENCOUNTER — Ambulatory Visit (HOSPITAL_BASED_OUTPATIENT_CLINIC_OR_DEPARTMENT_OTHER): Payer: 59 | Admitting: Hematology

## 2016-10-05 ENCOUNTER — Other Ambulatory Visit (HOSPITAL_BASED_OUTPATIENT_CLINIC_OR_DEPARTMENT_OTHER): Payer: 59

## 2016-10-05 ENCOUNTER — Encounter: Payer: Self-pay | Admitting: Hematology

## 2016-10-05 ENCOUNTER — Telehealth: Payer: Self-pay | Admitting: Hematology

## 2016-10-05 VITALS — BP 110/67 | HR 60 | Temp 97.7°F | Resp 18 | Ht 64.0 in | Wt 101.6 lb

## 2016-10-05 DIAGNOSIS — D696 Thrombocytopenia, unspecified: Secondary | ICD-10-CM | POA: Diagnosis not present

## 2016-10-05 DIAGNOSIS — C50311 Malignant neoplasm of lower-inner quadrant of right female breast: Secondary | ICD-10-CM

## 2016-10-05 DIAGNOSIS — Z79811 Long term (current) use of aromatase inhibitors: Secondary | ICD-10-CM

## 2016-10-05 DIAGNOSIS — Z170 Estrogen receptor positive status [ER+]: Secondary | ICD-10-CM

## 2016-10-05 DIAGNOSIS — D72819 Decreased white blood cell count, unspecified: Secondary | ICD-10-CM | POA: Diagnosis not present

## 2016-10-05 LAB — CBC WITH DIFFERENTIAL/PLATELET
BASO%: 0.6 % (ref 0.0–2.0)
Basophils Absolute: 0 10*3/uL (ref 0.0–0.1)
EOS%: 1.3 % (ref 0.0–7.0)
Eosinophils Absolute: 0 10*3/uL (ref 0.0–0.5)
HCT: 38.8 % (ref 34.8–46.6)
HEMOGLOBIN: 13.5 g/dL (ref 11.6–15.9)
LYMPH%: 31.5 % (ref 14.0–49.7)
MCH: 34.2 pg — ABNORMAL HIGH (ref 25.1–34.0)
MCHC: 34.8 g/dL (ref 31.5–36.0)
MCV: 98.2 fL (ref 79.5–101.0)
MONO#: 0.2 10*3/uL (ref 0.1–0.9)
MONO%: 6.4 % (ref 0.0–14.0)
NEUT%: 60.2 % (ref 38.4–76.8)
NEUTROS ABS: 1.8 10*3/uL (ref 1.5–6.5)
Platelets: 116 10*3/uL — ABNORMAL LOW (ref 145–400)
RBC: 3.95 10*6/uL (ref 3.70–5.45)
RDW: 12.8 % (ref 11.2–14.5)
WBC: 3 10*3/uL — AB (ref 3.9–10.3)
lymph#: 0.9 10*3/uL (ref 0.9–3.3)

## 2016-10-05 LAB — COMPREHENSIVE METABOLIC PANEL
ALBUMIN: 3.7 g/dL (ref 3.5–5.0)
ALK PHOS: 54 U/L (ref 40–150)
ALT: 21 U/L (ref 0–55)
AST: 26 U/L (ref 5–34)
Anion Gap: 9 mEq/L (ref 3–11)
BUN: 11.3 mg/dL (ref 7.0–26.0)
CO2: 23 mEq/L (ref 22–29)
Calcium: 8.9 mg/dL (ref 8.4–10.4)
Chloride: 108 mEq/L (ref 98–109)
Creatinine: 0.7 mg/dL (ref 0.6–1.1)
EGFR: >90 mL/min/{1.73_m2} (ref >90)
GLUCOSE: 98 mg/dL (ref 70–140)
Potassium: 3.9 mEq/L (ref 3.5–5.1)
SODIUM: 140 meq/L (ref 136–145)
TOTAL PROTEIN: 6.6 g/dL (ref 6.4–8.3)
Total Bilirubin: 0.37 mg/dL (ref 0.20–1.20)

## 2016-10-05 MED ORDER — TAMOXIFEN CITRATE 20 MG PO TABS: 20.0000 mg | ORAL_TABLET | Freq: Every day | ORAL | 11 refills | Status: DC

## 2016-10-05 NOTE — Telephone Encounter (Signed)
Gave avs and calendar for march 2019 

## 2016-10-09 DIAGNOSIS — Z23 Encounter for immunization: Secondary | ICD-10-CM | POA: Diagnosis not present

## 2016-10-12 ENCOUNTER — Other Ambulatory Visit: Payer: 59

## 2016-10-12 ENCOUNTER — Ambulatory Visit: Payer: 59 | Admitting: Hematology

## 2016-12-26 DIAGNOSIS — C50311 Malignant neoplasm of lower-inner quadrant of right female breast: Secondary | ICD-10-CM | POA: Diagnosis not present

## 2017-01-02 ENCOUNTER — Ambulatory Visit: Payer: 59 | Attending: General Surgery | Admitting: Physical Therapy

## 2017-01-02 DIAGNOSIS — M5412 Radiculopathy, cervical region: Secondary | ICD-10-CM | POA: Insufficient documentation

## 2017-01-02 DIAGNOSIS — R29898 Other symptoms and signs involving the musculoskeletal system: Secondary | ICD-10-CM | POA: Diagnosis not present

## 2017-01-02 NOTE — Therapy (Signed)
[name], [place], [address] Phone: [phone]   Fax:  [phone]  Physical Therapy Evaluation  Patient Details  Name: Shelly Smith MRN: [id number] Date of Birth: 09/05/1965 Referring Provider: Dr. Rolm Bookbinder   Encounter Date: 01/02/2017  PT End of Session - 01/02/17 1242    Visit Number  1    Number of Visits  9    Date for PT Re-Evaluation  02/08/17    PT Start Time  0940    PT Stop Time  1022    PT Time Calculation (min)  42 min    Activity Tolerance  Patient tolerated treatment well    Behavior During Therapy  [facility] for tasks assessed/performed       Past Medical History:  Diagnosis Date  . Breast cancer of lower-inner quadrant of right female breast (Shelly Smith) 10/10/2015  . History of bronchitis   . History of radiation therapy 12/27/15- 02/08/16   Right Chest Wall, Axilla, SCV treated to 50 Gy in 25 fractions, and IM nodes to >45 Gy in 25 fractions, and then chest wall scar boosted an additional 10 Gy in 5 fractions.    Past Surgical History:  Procedure Laterality Date  . MASTECTOMY W/ SENTINEL NODE BIOPSY Right 11/21/2015   Procedure: RIGHT TOTAL MASTECTOMY WITH RIGHT AXILLARY SENTINEL LYMPH NODE BIOPSY;  Surgeon: Rolm Bookbinder, MD;  Location: [facility];  Service: General;  Laterality: Right;    There were no vitals filed for this visit.   Subjective Assessment - 01/02/17 0941    Subjective  I had a mastectomy last year. I came here and I was discharged after two visits because I was doing well. Around Thanksgiving I started to feel soreness in the right arm; after a couple weeks, I start to feel the tingling down the arm down to the thumb, on the thumb side. Raising the arm up overhead relieves it.    Pertinent History  Right breast cancer diagnosed in September 2017 and had mastectomy in November 2017; she had radiation which was completed around end of January 2018. On tamoxifen now.   Had 1-2 lymph nodes removed, and they were positive (that's why she had XRT).     Patient Stated Goals  figure out what the tingling is and how to make it go away    Currently in Pain?  Yes    Pain Score  5     Pain Location  Arm    Pain Orientation  Right;Lateral down thumb side of arm    Pain Descriptors / Indicators  Tingling    Pain Type  Acute pain    Pain Onset  1 to 4 weeks ago    Pain Frequency  Intermittent    Aggravating Factors   activity    Pain Relieving Factors  raising arm up overhead    Effect of Pain on Daily Activities  has to stop in the middle of doing something         [facility] PT Assessment - 01/02/17 0001      Assessment   Medical Diagnosis  right breast cancer with mastectomy November 2017 followed by XRT    Referring Provider  Dr. Rolm Bookbinder    Onset Date/Surgical Date  12/06/16 for soreness, then tingling right arm    Hand Dominance  Right    Prior Therapy  none      Precautions   Precautions  Other (comment)    Precaution Comments  cancer  precautions      Restrictions   Weight Bearing Restrictions  No      Balance Screen   Has the patient fallen in the past 6 months  No    Has the patient had a decrease in activity level because of a fear of falling?   No    Is the patient reluctant to leave their home because of a fear of falling?   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, no heavy lifting; tries to avoid lifting with right    Leisure  dances twice a week for 1.5 hours      Cognition   Overall Cognitive Status  Within Functional Limits for tasks assessed      Observation/Other Assessments   Observations  She reports she still has swelling at right upper flank  since surgery    Skin Integrity  mastectomy incision well healed; skin darkened from XRT      Coordination   Gross Motor Movements are Fluid and Coordinated  Yes      Posture/Postural Control   Posture/Postural Control  Postural limitations    Postural Limitations  Forward head      ROM / Strength   AROM / PROM / Strength  AROM      AROM   Overall AROM Comments  shoulder AROM is WFL bilat, but right is slightly limited compared to left, and patient feels tightness in her right axilla with end range    AROM Assessment Site  Shoulder;Cervical    Right/Left Shoulder  Right;Left    Cervical Flexion  WFL    Cervical Extension  WFL    Cervical - Right Side Bend  45 causes tingling down the right arm    Cervical - Left Side Bend  55    Cervical - Right Rotation  WFL    Cervical - Left Rotation  WFL      Palpation   Palpation comment  right chest tissue mobility is limited at mastectomy scar; typical tightness at upper traps      Special Tests    Special Tests  Cervical    Cervical Tests  Spurling's;Dictraction;other;other2      Spurling's   Findings  Negative      Distraction Test   Findngs  Positive    Comment  tingling may have been relieved with this, but because it is intermittent, testing is not definitive      other    Findings  Positive    Side  Right    Comment  foraminal closure test      other    Side  Right    Comment  chin tuck with retraction increased tingling           [name] - 01/02/17 0001    Open a tight or new jar  No difficulty    Do heavy household chores (wash walls, wash floors)  Mild difficulty    Carry a shopping bag or briefcase  Mild difficulty    Wash your back  No difficulty    Use a knife to cut food  No difficulty  Recreational activities in which you take some force or impact through your arm, shoulder, or hand (golf, hammering, tennis)  No difficulty    During the past week, to what extent has your arm, shoulder or hand  problem interfered with your normal social activities with family, friends, neighbors, or groups?  Modererately    During the past week, to what extent has your arm, shoulder or hand problem limited your work or other regular daily activities  Slightly    Arm, shoulder, or hand pain.  None    Tingling (pins and needles) in your arm, shoulder, or hand  Moderate    Difficulty Sleeping  No difficulty    DASH Score  15.91 %       Objective measurements completed on examination: See above findings.      [facility] Adult PT Treatment/Exercise - 01/02/17 0001      Exercises   Exercises  Neck      Neck Exercises: Seated   Neck Retraction  1 rep did not relieve tingling      Manual Therapy   Manual Therapy  Manual Traction    Manual Traction  to neck in supine, slight flexion x 5 mins tingling returned after about a minute sitting             PT Education - 01/02/17 1241    Education provided  Yes    Education Details  Advised patient as much as possible to avoid positions or activities that trigger arm tingling and to do those that minimize it, as she is able.    Person(s) Educated  Patient    Methods  Explanation    Comprehension  Verbalized understanding          PT Long Term Goals - 01/02/17 1707      PT LONG TERM GOAL #1   Title  Pt. will have at least 75% decrease in tingling in right UE, in frequency and/or intensity.    Time  4    Period  Weeks    Status  New      PT LONG TERM GOAL #2   Title  Pt. will be independent in HEP for minimizing neck symptoms (tingling) as well as for cervical stabilization.    Time  4    Period  Weeks    Status  New      PT LONG TERM GOAL #3   Title  quick DASH score reduced to <10    Baseline  15.91 at evaluation    Time  4    Period  Weeks    Status  New             Plan - 01/02/17 1245    Clinical Impression Statement  This is a pleasant woman s/p mastectomy for right breast cancer over a year ago.  She has developed  tingling in her right UE that is intermittent.  Assessment today indicates this seems to stem from an issue at her neck, as tingling can be triggered or intensified with right flexion  or right sidebend with extension of her neck, and it seems to be at C6 distribution.  The tingling is very intermittent, and comes and goes frequently without much provocation.  It was difficult today to be definitive about which movements ameliorated and which triggered the paresthesia, though right sidebend definitely did. Rt. sidebend ROM was also limited.    History and Personal Factors relevant to plan of care:  right breast cancer with  mastectomy    Clinical Presentation  Evolving    Clinical Presentation due to:  new onset of right arm tingling    Clinical Decision Making  Moderate    Rehab Potential  Good    PT Frequency  2x / week    PT Duration  4 weeks    PT Treatment/Interventions  ADLs/Self Care Home Management;Traction;Therapeutic exercise;Patient/family education;Manual techniques;Passive range of motion    PT Next Visit Plan  Priority is to determine exercise she can do to eliminate tingling:  try neck extension with towel for traction, try flexion; other McKenzie testing; mechanical traction on static    Consulted and Agree with Plan of Care  Patient       Patient will benefit from skilled therapeutic intervention in order to improve the following deficits and impairments:  Impaired sensation, Decreased range of motion, Impaired UE functional use  Visit Diagnosis: Other symptoms and signs involving the musculoskeletal system - Plan: PT plan of care cert/re-cert  Radiculopathy, cervical region - Plan: PT plan of care cert/re-cert     Problem List Patient Active Problem List   Diagnosis Date Noted  . Cough 04/17/2016  . Breast cancer of lower-inner quadrant of right female breast ([place]) 10/10/2015    Taler Kushner 01/02/2017, 5:12 PM  [place], [place], [address] Phone: [phone]   Fax:  [phone]  Name: Israa Caban MRN: [id number] Date of Birth: 05-03-1965  Serafina Royals, PT 01/02/17 5:12 PM

## 2017-01-04 ENCOUNTER — Encounter: Payer: Self-pay | Admitting: Physical Therapy

## 2017-01-04 ENCOUNTER — Ambulatory Visit: Payer: 59 | Admitting: Physical Therapy

## 2017-01-04 DIAGNOSIS — R29898 Other symptoms and signs involving the musculoskeletal system: Secondary | ICD-10-CM | POA: Diagnosis not present

## 2017-01-04 DIAGNOSIS — M5412 Radiculopathy, cervical region: Secondary | ICD-10-CM

## 2017-01-04 NOTE — Therapy (Signed)
[place], [place], [address] Phone: [phone]   Fax:  [phone]  Physical Therapy Treatment  Patient Details  Name: Charlize Hathaway MRN: [id number] Date of Birth: [birth year] Referring Provider: Dr. Rolm Bookbinder   Encounter Date: 01/04/2017  PT End of Session - 01/04/17 1049    Visit Number  2    Number of Visits  9    Date for PT Re-Evaluation  02/08/17    PT Start Time  1004    PT Stop Time  1047    PT Time Calculation (min)  43 min    Activity Tolerance  Patient tolerated treatment well    Behavior During Therapy  [facility] for tasks assessed/performed       Past Medical History:  Diagnosis Date  . Breast cancer of lower-inner quadrant of right female breast ([place]) 10/10/2015  . History of bronchitis   . History of radiation therapy 12/27/15- 02/08/16   Right Chest Wall, Axilla, SCV treated to 50 Gy in 25 fractions, and IM nodes to >45 Gy in 25 fractions, and then chest wall scar boosted an additional 10 Gy in 5 fractions.    Past Surgical History:  Procedure Laterality Date  . MASTECTOMY W/ SENTINEL NODE BIOPSY Right 11/21/2015   Procedure: RIGHT TOTAL MASTECTOMY WITH RIGHT AXILLARY SENTINEL LYMPH NODE BIOPSY;  Surgeon: Rolm Bookbinder, MD;  Location: [facility];  Service: General;  Laterality: Right;    There were no vitals filed for this visit.  Subjective Assessment - 01/04/17 1005    Subjective  I am still having the tingling pain when I reach down to clean the floor. My arm tingles when it is down or if I shake my arm with my arm down by my side.     Pertinent History  Right breast cancer diagnosed in September 2017 and had mastectomy in November 2017; she had radiation which was completed around end of January 2018. On tamoxifen now.  Had 1-2 lymph nodes removed, and they were positive (that's why she had XRT).     Patient Stated Goals  figure out what the tingling is and how to make it go away     Currently in Pain?  No/denies    Pain Score  0-No pain                      [facility] Adult PT Treatment/Exercise - 01/04/17 0001      Manual Therapy   Manual Therapy  Manual Traction;Neural Stretch;Soft tissue mobilization;Passive ROM    Manual therapy comments  --    Soft tissue mobilization  in L S/L to R upper traps, rhomboids, levator scapula and cervical musculature. Pressure on areas of upper trap/levator elicited tingling in RUE, rope like texture present in these areas    Passive ROM  gently in direction of bilateral neck rotation of lateral flexion    Manual Traction  gently to neck in supine x 2 min    Neural Stretch  pt with positive upper limb tension test                  PT Long Term Goals - 01/02/17 1707      PT LONG TERM GOAL #1   Title  Pt. will have at least 75% decrease in tingling in right UE, in frequency and/or intensity.    Time  4    Period  Weeks    Status  New  PT LONG TERM GOAL #2   Title  Pt. will be independent in HEP for minimizing neck symptoms (tingling) as well as for cervical stabilization.    Time  4    Period  Weeks    Status  New      PT LONG TERM GOAL #3   Title  quick DASH score reduced to <10    Baseline  15.91 at evaluation    Time  4    Period  Weeks    Status  New            Plan - 01/04/17 1050    Clinical Impression Statement  Pt has positive nerve tension test as well as shoulder abduction test which could indicate a pinched nerve. Since she does not consistently have the tingling in her arm decided to try massage to upper back, levator and cervical muscles on right. Pressure on areas of upper trap/levator did elicite same tingling pt has been feeling in her UE. Encouraged pt to try massage with tennis ball at home. Will assess if she had decreaed instances of tingling at next session.     Rehab Potential  Good    PT Frequency  2x / week    PT Duration  4 weeks    PT Treatment/Interventions   ADLs/Self Care Home Management;Traction;Therapeutic exercise;Patient/family education;Manual techniques;Passive range of motion    PT Next Visit Plan  See if massage decreased tingling, give cervical isometrics and gentle ROM, possible upper trap strengthening since R shoulder is slightly dropped compared to L, Priority is to determine exercise she can do to eliminate tingling:  try neck extension with towel for traction, try flexion; other McKenzie testing; mechanical traction on static    Consulted and Agree with Plan of Care  Patient       Patient will benefit from skilled therapeutic intervention in order to improve the following deficits and impairments:  Impaired sensation, Decreased range of motion, Impaired UE functional use  Visit Diagnosis: Other symptoms and signs involving the musculoskeletal system  Radiculopathy, cervical region     Problem List Patient Active Problem List   Diagnosis Date Noted  . Cough 04/17/2016  . Breast cancer of lower-inner quadrant of right female breast [facility]) 10/10/2015    Allyson Sabal [facility] 01/04/2017, 10:56 AM  [place], [place], [address] Phone: [phone]   Fax:  [phone]  Name: Timaya Bojarski MRN: [id number] Date of Birth: 1965-08-04  Manus Gunning, PT 01/04/17 10:56 AM

## 2017-01-07 ENCOUNTER — Ambulatory Visit: Payer: 59 | Admitting: Physical Therapy

## 2017-01-07 DIAGNOSIS — R29898 Other symptoms and signs involving the musculoskeletal system: Secondary | ICD-10-CM | POA: Diagnosis not present

## 2017-01-07 DIAGNOSIS — M5412 Radiculopathy, cervical region: Secondary | ICD-10-CM

## 2017-01-07 NOTE — Therapy (Signed)
[place], [place], [address] Phone: [phone]   Fax:  [phone]  Physical Therapy Treatment  Patient Details  Name: Shelly Smith MRN: [id number] Date of Birth: 1965-09-22 Referring Provider: Dr. Rolm Bookbinder   Encounter Date: 01/07/2017  PT End of Session - 01/07/17 1214    Visit Number  3    Number of Visits  9    Date for PT Re-Evaluation  02/08/17    PT Start Time  1019    PT Stop Time  1101    PT Time Calculation (min)  42 min    Activity Tolerance  Patient tolerated treatment well    Behavior During Therapy  [facility] for tasks assessed/performed       Past Medical History:  Diagnosis Date  . Breast cancer of lower-inner quadrant of right female breast ([place]) 10/10/2015  . History of bronchitis   . History of radiation therapy 12/27/15- 02/08/16   Right Chest Wall, Axilla, SCV treated to 50 Gy in 25 fractions, and IM nodes to >45 Gy in 25 fractions, and then chest wall scar boosted an additional 10 Gy in 5 fractions.    Past Surgical History:  Procedure Laterality Date  . MASTECTOMY W/ SENTINEL NODE BIOPSY Right 11/21/2015   Procedure: RIGHT TOTAL MASTECTOMY WITH RIGHT AXILLARY SENTINEL LYMPH NODE BIOPSY;  Surgeon: Rolm Bookbinder, MD;  Location: [facility];  Service: General;  Laterality: Right;    There were no vitals filed for this visit.  Subjective Assessment - 01/07/17 1018    Subjective  The tingling's getting better.  I noticed it when I drive here, I didn't have to raise my hand.  It's not that frequent. Had dance on Friday night and did some stretching after that and it did seembetter after that. Did wake up last night with tingling and found she was lying in a strange position.    Pertinent History  Right breast cancer diagnosed in September 2017 and had mastectomy in November 2017; she had radiation which was completed around end of January 2018. On tamoxifen now.  Had 1-2 lymph nodes  removed, and they were positive (that's why she had XRT).     Currently in Pain?  No/denies                      [facility] Adult PT Treatment/Exercise - 01/07/17 0001      Self-Care   Self-Care  Other Self-Care Comments    Other Self-Care Comments   showed patient spine model and explained possible cause of her symptoms as cervical radiculopathy from a bulging disc; also explained what we are trying to accomplish with therapy, finding exercise and/or positioning that minimizes symptoms and avoiding those that aggravate symptoms      Neck Exercises: Seated   Other Seated Exercise  Tried chin retraction, then neck extension with slight rotations each way: tingling increased with rotation right; also seemed to increase with rotation left      Shoulder Exercises: Stretch   Other Shoulder Stretches  stand with arms behind back, reach back and down with arms pt. also did neck flexion    Other Shoulder Stretches  hooklying lower trunk rotation with right arm outstretched, knees to left and turning head to right      Manual Therapy   Passive ROM  tried neck retraction passively in supine, but caused symptoms; tried neck extension, which also caused symptoms; manual traction with gentle rotation left and  right; also with sidebend to right--able to move patient into these positions partial range without symptoms     Manual Traction  to neck with hands on neck, also on O-A release    Neural Stretch  right UE neural tension stretch             PT Education - 01/07/17 1213    Education provided  Yes    Education Details  continue stretches that she has found that minimize symptoms    Person(s) Educated  Patient    Methods  Explanation    Comprehension  Verbalized understanding          PT Long Term Goals - 01/02/17 1707      PT LONG TERM GOAL #1   Title  Pt. will have at least 75% decrease in tingling in right UE, in frequency and/or intensity.    Time  4    Period  Weeks     Status  New      PT LONG TERM GOAL #2   Title  Pt. will be independent in HEP for minimizing neck symptoms (tingling) as well as for cervical stabilization.    Time  4    Period  Weeks    Status  New      PT LONG TERM GOAL #3   Title  quick DASH score reduced to <10    Baseline  15.91 at evaluation    Time  4    Period  Weeks    Status  New            Plan - 01/07/17 1246    Clinical Impression Statement  Pt. did some stretches after dancing a few nights ago that she feels help decrease the frequency of her symptoms.  Symptom relief was not obtained with other exercise tried today in clinic, so patient was encouraged to do the exercises that she found helpful.  Time spent today explaining, because patient asked about it, what may be the cause of her arm tingling and how we would like to manage it.     Rehab Potential  Good    PT Frequency  2x / week    PT Duration  4 weeks    PT Treatment/Interventions  ADLs/Self Care Home Management;Traction;Therapeutic exercise;Patient/family education;Manual techniques;Passive range of motion    PT Next Visit Plan  give cervical isometrics and gentle ROM, possible upper trap strengthening since R shoulder is slightly dropped compared to L, Priority is to determine exercise she can do to eliminate tingling:  consider mechanical traction or continue manual traction    PT Home Exercise Plan  arms behind back and pull backward and down; hooklying with right arm outstretched, lower trunk rotation to left    Consulted and Agree with Plan of Care  Patient       Patient will benefit from skilled therapeutic intervention in order to improve the following deficits and impairments:  Impaired sensation, Decreased range of motion, Impaired UE functional use  Visit Diagnosis: Other symptoms and signs involving the musculoskeletal system  Radiculopathy, cervical region     Problem List Patient Active Problem List   Diagnosis Date Noted  . Cough  04/17/2016  . Breast cancer of lower-inner quadrant of right female breast [facility]) 10/10/2015    S[place],DONNA 01/07/2017, 12:51 PM  [place], [place], [address] Phone: [phone]   Fax:  [phone]  Name: Shelly Smith MRN: [id number] Date of Birth:  [birth year]  Serafina Royals, PT 01/07/17 12:51 PM

## 2017-01-10 ENCOUNTER — Ambulatory Visit: Payer: 59 | Admitting: Physical Therapy

## 2017-01-18 ENCOUNTER — Ambulatory Visit: Payer: 59 | Attending: General Surgery | Admitting: Physical Therapy

## 2017-01-18 DIAGNOSIS — R29898 Other symptoms and signs involving the musculoskeletal system: Secondary | ICD-10-CM | POA: Diagnosis not present

## 2017-01-18 DIAGNOSIS — M5412 Radiculopathy, cervical region: Secondary | ICD-10-CM | POA: Insufficient documentation

## 2017-01-18 NOTE — Patient Instructions (Signed)
Neck Isometric Exercises:  Sitting: Gently press head into hand like you are trying to look down at the floor- Hold for 5 sec and repeat for 5 reps  Lying down: Gently press head backwards into pillow- make sure head is not elevated but in neutral position - Hold for 5 sec and repeat for 5 reps  Gently turn head to left giving resistance with left hand - Hold for 5 sec and repeat for 5 reps- Repeat on right side  Gently try to bring left ear to left shoulder giving resistance with left hand - Hold for 5 sec and repeat for 5 reps. Repeat on right side

## 2017-01-18 NOTE — Therapy (Addendum)
[place], [place], [address] Phone: [phone]   Fax:  [phone]  Physical Therapy Treatment  Patient Details  Name: Shelly Smith MRN: [id number] Date of Birth: [birth year] Referring Provider: Dr. Rolm Bookbinder   Encounter Date: 01/18/2017  PT End of Session - 01/18/17 1146    Visit Number  4    Number of Visits  9    Date for PT Re-Evaluation  02/08/17    PT Start Time  1110    PT Stop Time  1146    PT Time Calculation (min)  36 min    Activity Tolerance  Patient tolerated treatment well    Behavior During Therapy  [facility] for tasks assessed/performed       Past Medical History:  Diagnosis Date  . Breast cancer of lower-inner quadrant of right female breast ([place]) 10/10/2015  . History of bronchitis   . History of radiation therapy 12/27/15- 02/08/16   Right Chest Wall, Axilla, SCV treated to 50 Gy in 25 fractions, and IM nodes to >45 Gy in 25 fractions, and then chest wall scar boosted an additional 10 Gy in 5 fractions.    Past Surgical History:  Procedure Laterality Date  . MASTECTOMY W/ SENTINEL NODE BIOPSY Right 11/21/2015   Procedure: RIGHT TOTAL MASTECTOMY WITH RIGHT AXILLARY SENTINEL LYMPH NODE BIOPSY;  Surgeon: Rolm Bookbinder, MD;  Location: [place];  Service: General;  Laterality: Right;    There were no vitals filed for this visit.  Subjective Assessment - 01/18/17 1110    Subjective  The tingling is still there but I don't need to raise my hand when driving. I could not tell any difference after the massage. I didn't have any improvement after last session either.     Pertinent History  Right breast cancer diagnosed in September 2017 and had mastectomy in November 2017; she had radiation which was completed around end of January 2018. On tamoxifen now.  Had 1-2 lymph nodes removed, and they were positive (that's why she had XRT).     Patient Stated Goals  figure out what the tingling is  and how to make it go away    Currently in Pain?  No/denies    Pain Score  0-No pain                      [facility] Adult PT Treatment/Exercise - 01/18/17 0001      Neck Exercises: Supine   Cervical Isometrics  Right lateral flexion;Flexion;Extension;Left lateral flexion;Right rotation;Left rotation;5 secs;5 reps      Manual Therapy   Passive ROM  in L and R lateral flexion and L and R rotation with no increase in tingling    Manual Traction  to neck with hands on neck x 5, also on O-A release    Neural Stretch  right UE neural tension stretch and instructed pt in this stretch on wall                  PT Long Term Goals - 01/02/17 1707      PT LONG TERM GOAL #1   Title  Pt. will have at least 75% decrease in tingling in right UE, in frequency and/or intensity.    Time  4    Period  Weeks    Status  New      PT LONG TERM GOAL #2   Title  Pt. will be independent in HEP for minimizing neck  symptoms (tingling) as well as for cervical stabilization.    Time  4    Period  Weeks    Status  New      PT LONG TERM GOAL #3   Title  quick DASH score reduced to <10    Baseline  15.91 at evaluation    Time  4    Period  Weeks    Status  New            Plan - 01/18/17 1147    Clinical Impression Statement  Pt has not been feeling much benefit from therapy. She plans on seeing a Chiropractor on Monday and wanted to cancer her Monday appointment here. She will let us know if she wants to continue. Focused on traction today to cervical region and issued cervical isometrics and median nerve stretch for pt to add to HEP. By end of session pt felt the traction had helped because she was no longer having tingling with median nerve stretch.     Rehab Potential  Good    PT Frequency  2x / week    PT Duration  4 weeks    PT Treatment/Interventions  ADLs/Self Care Home Management;Traction;Therapeutic exercise;Patient/family education;Manual techniques;Passive range of  motion    PT Next Visit Plan  see if cervical isometrics and median nerve stretch reduced tingling, gentle ROM, possible upper trap strengthening since R shoulder is slightly dropped compared to L, Priority is to determine exercise she can do to eliminate tingling:  consider mechanical traction or continue manual traction    PT Home Exercise Plan  arms behind back and pull backward and down; hooklying with right arm outstretched, lower trunk rotation to left, cervical isometrics, median nerve stretch    Consulted and Agree with Plan of Care  Patient       Patient will benefit from skilled therapeutic intervention in order to improve the following deficits and impairments:  Impaired sensation, Decreased range of motion, Impaired UE functional use  Visit Diagnosis: Other symptoms and signs involving the musculoskeletal system  Radiculopathy, cervical region     Problem List Patient Active Problem List   Diagnosis Date Noted  . Cough 04/17/2016  . Breast cancer of lower-inner quadrant of right female breast [facility]) 10/10/2015    Allyson Sabal [facility] 01/18/2017, 11:49 AM  [place], [place], [address] Phone: [phone]   Fax:  [phone]  Name: Shelly Smith MRN: [id number] Date of Birth: [birth year]  Manus Gunning, PT 01/18/17 11:49 AM  PHYSICAL THERAPY DISCHARGE SUMMARY  Visits from Start of Care: 4  Current functional level related to goals / functional outcomes: See above   Remaining deficits: See above   Education / Equipment: See above  Plan: Patient agrees to discharge.  Patient goals were not met. Patient is being discharged due to not returning since the last visit.  ?????     Allyson Sabal [place], [place] 03/18/17 11:36 AM

## 2017-01-21 ENCOUNTER — Encounter: Payer: 59 | Admitting: Physical Therapy

## 2017-01-21 DIAGNOSIS — M531 Cervicobrachial syndrome: Secondary | ICD-10-CM | POA: Diagnosis not present

## 2017-01-21 DIAGNOSIS — M5022 Other cervical disc displacement, mid-cervical region, unspecified level: Secondary | ICD-10-CM | POA: Diagnosis not present

## 2017-01-21 DIAGNOSIS — M9901 Segmental and somatic dysfunction of cervical region: Secondary | ICD-10-CM | POA: Diagnosis not present

## 2017-01-22 DIAGNOSIS — M5022 Other cervical disc displacement, mid-cervical region, unspecified level: Secondary | ICD-10-CM | POA: Diagnosis not present

## 2017-01-22 DIAGNOSIS — M531 Cervicobrachial syndrome: Secondary | ICD-10-CM | POA: Diagnosis not present

## 2017-01-22 DIAGNOSIS — M9901 Segmental and somatic dysfunction of cervical region: Secondary | ICD-10-CM | POA: Diagnosis not present

## 2017-01-23 ENCOUNTER — Encounter: Payer: 59 | Admitting: Physical Therapy

## 2017-01-24 DIAGNOSIS — M5022 Other cervical disc displacement, mid-cervical region, unspecified level: Secondary | ICD-10-CM | POA: Diagnosis not present

## 2017-01-24 DIAGNOSIS — M531 Cervicobrachial syndrome: Secondary | ICD-10-CM | POA: Diagnosis not present

## 2017-01-24 DIAGNOSIS — M9901 Segmental and somatic dysfunction of cervical region: Secondary | ICD-10-CM | POA: Diagnosis not present

## 2017-01-28 ENCOUNTER — Encounter: Payer: 59 | Admitting: Physical Therapy

## 2017-01-28 DIAGNOSIS — M531 Cervicobrachial syndrome: Secondary | ICD-10-CM | POA: Diagnosis not present

## 2017-01-28 DIAGNOSIS — M5022 Other cervical disc displacement, mid-cervical region, unspecified level: Secondary | ICD-10-CM | POA: Diagnosis not present

## 2017-01-28 DIAGNOSIS — M9901 Segmental and somatic dysfunction of cervical region: Secondary | ICD-10-CM | POA: Diagnosis not present

## 2017-01-29 DIAGNOSIS — M531 Cervicobrachial syndrome: Secondary | ICD-10-CM | POA: Diagnosis not present

## 2017-01-29 DIAGNOSIS — M5022 Other cervical disc displacement, mid-cervical region, unspecified level: Secondary | ICD-10-CM | POA: Diagnosis not present

## 2017-01-29 DIAGNOSIS — M9901 Segmental and somatic dysfunction of cervical region: Secondary | ICD-10-CM | POA: Diagnosis not present

## 2017-01-30 ENCOUNTER — Encounter: Payer: 59 | Admitting: Physical Therapy

## 2017-01-31 DIAGNOSIS — M5022 Other cervical disc displacement, mid-cervical region, unspecified level: Secondary | ICD-10-CM | POA: Diagnosis not present

## 2017-01-31 DIAGNOSIS — M531 Cervicobrachial syndrome: Secondary | ICD-10-CM | POA: Diagnosis not present

## 2017-01-31 DIAGNOSIS — M9901 Segmental and somatic dysfunction of cervical region: Secondary | ICD-10-CM | POA: Diagnosis not present

## 2017-02-04 ENCOUNTER — Encounter: Payer: 59 | Admitting: Physical Therapy

## 2017-02-04 DIAGNOSIS — M5022 Other cervical disc displacement, mid-cervical region, unspecified level: Secondary | ICD-10-CM | POA: Diagnosis not present

## 2017-02-04 DIAGNOSIS — M9901 Segmental and somatic dysfunction of cervical region: Secondary | ICD-10-CM | POA: Diagnosis not present

## 2017-02-04 DIAGNOSIS — M531 Cervicobrachial syndrome: Secondary | ICD-10-CM | POA: Diagnosis not present

## 2017-02-05 DIAGNOSIS — M5022 Other cervical disc displacement, mid-cervical region, unspecified level: Secondary | ICD-10-CM | POA: Diagnosis not present

## 2017-02-05 DIAGNOSIS — M9901 Segmental and somatic dysfunction of cervical region: Secondary | ICD-10-CM | POA: Diagnosis not present

## 2017-02-05 DIAGNOSIS — M531 Cervicobrachial syndrome: Secondary | ICD-10-CM | POA: Diagnosis not present

## 2017-02-06 ENCOUNTER — Encounter: Payer: 59 | Admitting: Physical Therapy

## 2017-02-07 DIAGNOSIS — M5022 Other cervical disc displacement, mid-cervical region, unspecified level: Secondary | ICD-10-CM | POA: Diagnosis not present

## 2017-02-07 DIAGNOSIS — M531 Cervicobrachial syndrome: Secondary | ICD-10-CM | POA: Diagnosis not present

## 2017-02-07 DIAGNOSIS — M9901 Segmental and somatic dysfunction of cervical region: Secondary | ICD-10-CM | POA: Diagnosis not present

## 2017-02-11 DIAGNOSIS — M531 Cervicobrachial syndrome: Secondary | ICD-10-CM | POA: Diagnosis not present

## 2017-02-11 DIAGNOSIS — M5022 Other cervical disc displacement, mid-cervical region, unspecified level: Secondary | ICD-10-CM | POA: Diagnosis not present

## 2017-02-11 DIAGNOSIS — M9901 Segmental and somatic dysfunction of cervical region: Secondary | ICD-10-CM | POA: Diagnosis not present

## 2017-02-12 DIAGNOSIS — M9901 Segmental and somatic dysfunction of cervical region: Secondary | ICD-10-CM | POA: Diagnosis not present

## 2017-02-12 DIAGNOSIS — M531 Cervicobrachial syndrome: Secondary | ICD-10-CM | POA: Diagnosis not present

## 2017-02-12 DIAGNOSIS — M5022 Other cervical disc displacement, mid-cervical region, unspecified level: Secondary | ICD-10-CM | POA: Diagnosis not present

## 2017-02-14 DIAGNOSIS — M531 Cervicobrachial syndrome: Secondary | ICD-10-CM | POA: Diagnosis not present

## 2017-02-14 DIAGNOSIS — M5022 Other cervical disc displacement, mid-cervical region, unspecified level: Secondary | ICD-10-CM | POA: Diagnosis not present

## 2017-02-14 DIAGNOSIS — M9901 Segmental and somatic dysfunction of cervical region: Secondary | ICD-10-CM | POA: Diagnosis not present

## 2017-02-26 DIAGNOSIS — M531 Cervicobrachial syndrome: Secondary | ICD-10-CM | POA: Diagnosis not present

## 2017-02-26 DIAGNOSIS — M5022 Other cervical disc displacement, mid-cervical region, unspecified level: Secondary | ICD-10-CM | POA: Diagnosis not present

## 2017-02-26 DIAGNOSIS — M9901 Segmental and somatic dysfunction of cervical region: Secondary | ICD-10-CM | POA: Diagnosis not present

## 2017-02-28 DIAGNOSIS — M5022 Other cervical disc displacement, mid-cervical region, unspecified level: Secondary | ICD-10-CM | POA: Diagnosis not present

## 2017-02-28 DIAGNOSIS — M9901 Segmental and somatic dysfunction of cervical region: Secondary | ICD-10-CM | POA: Diagnosis not present

## 2017-02-28 DIAGNOSIS — M531 Cervicobrachial syndrome: Secondary | ICD-10-CM | POA: Diagnosis not present

## 2017-03-05 DIAGNOSIS — M9901 Segmental and somatic dysfunction of cervical region: Secondary | ICD-10-CM | POA: Diagnosis not present

## 2017-03-05 DIAGNOSIS — M531 Cervicobrachial syndrome: Secondary | ICD-10-CM | POA: Diagnosis not present

## 2017-03-05 DIAGNOSIS — M5022 Other cervical disc displacement, mid-cervical region, unspecified level: Secondary | ICD-10-CM | POA: Diagnosis not present

## 2017-03-07 DIAGNOSIS — M531 Cervicobrachial syndrome: Secondary | ICD-10-CM | POA: Diagnosis not present

## 2017-03-07 DIAGNOSIS — M9901 Segmental and somatic dysfunction of cervical region: Secondary | ICD-10-CM | POA: Diagnosis not present

## 2017-03-07 DIAGNOSIS — M5022 Other cervical disc displacement, mid-cervical region, unspecified level: Secondary | ICD-10-CM | POA: Diagnosis not present

## 2017-03-12 DIAGNOSIS — M9901 Segmental and somatic dysfunction of cervical region: Secondary | ICD-10-CM | POA: Diagnosis not present

## 2017-03-12 DIAGNOSIS — M531 Cervicobrachial syndrome: Secondary | ICD-10-CM | POA: Diagnosis not present

## 2017-03-12 DIAGNOSIS — M5022 Other cervical disc displacement, mid-cervical region, unspecified level: Secondary | ICD-10-CM | POA: Diagnosis not present

## 2017-03-21 ENCOUNTER — Telehealth: Payer: Self-pay | Admitting: Hematology

## 2017-03-21 NOTE — Telephone Encounter (Signed)
Tried to call regarding vm °

## 2017-03-22 ENCOUNTER — Telehealth: Payer: Self-pay | Admitting: Hematology

## 2017-03-22 NOTE — Telephone Encounter (Signed)
Patient called to r/s appointment. Patient would like to have labs drawn by PCP and will send copy of labs to Dr.Feng.

## 2017-04-04 ENCOUNTER — Ambulatory Visit: Payer: 59 | Admitting: Hematology

## 2017-04-04 ENCOUNTER — Other Ambulatory Visit: Payer: 59

## 2017-04-08 ENCOUNTER — Other Ambulatory Visit: Payer: Self-pay | Admitting: Physician Assistant

## 2017-04-08 DIAGNOSIS — Z130 Encounter for screening for diseases of the blood and blood-forming organs and certain disorders involving the immune mechanism: Secondary | ICD-10-CM

## 2017-04-08 DIAGNOSIS — Z13220 Encounter for screening for lipoid disorders: Secondary | ICD-10-CM

## 2017-04-08 DIAGNOSIS — Z0000 Encounter for general adult medical examination without abnormal findings: Secondary | ICD-10-CM

## 2017-04-08 DIAGNOSIS — Z79810 Long term (current) use of selective estrogen receptor modulators (SERMs): Secondary | ICD-10-CM

## 2017-04-08 DIAGNOSIS — Z131 Encounter for screening for diabetes mellitus: Secondary | ICD-10-CM

## 2017-04-12 ENCOUNTER — Encounter: Payer: Self-pay | Admitting: Physician Assistant

## 2017-04-12 ENCOUNTER — Other Ambulatory Visit: Payer: Self-pay

## 2017-04-12 ENCOUNTER — Other Ambulatory Visit (HOSPITAL_COMMUNITY)
Admission: RE | Admit: 2017-04-12 | Discharge: 2017-04-12 | Disposition: A | Discharge status: Home / Self Care | Payer: 59 | Source: Ambulatory Visit | Attending: Physician Assistant | Admitting: Physician Assistant

## 2017-04-12 ENCOUNTER — Ambulatory Visit (INDEPENDENT_AMBULATORY_CARE_PROVIDER_SITE_OTHER): Payer: 59 | Admitting: Physician Assistant

## 2017-04-12 VITALS — BP 106/70 | HR 73 | Resp 14 | Wt 108.0 lb

## 2017-04-12 DIAGNOSIS — Z79810 Long term (current) use of selective estrogen receptor modulators (SERMs): Secondary | ICD-10-CM

## 2017-04-12 DIAGNOSIS — Z130 Encounter for screening for diseases of the blood and blood-forming organs and certain disorders involving the immune mechanism: Secondary | ICD-10-CM

## 2017-04-12 DIAGNOSIS — Z23 Encounter for immunization: Secondary | ICD-10-CM

## 2017-04-12 DIAGNOSIS — Z0000 Encounter for general adult medical examination without abnormal findings: Secondary | ICD-10-CM | POA: Diagnosis not present

## 2017-04-12 DIAGNOSIS — Z131 Encounter for screening for diabetes mellitus: Secondary | ICD-10-CM

## 2017-04-12 DIAGNOSIS — Z13220 Encounter for screening for lipoid disorders: Secondary | ICD-10-CM

## 2017-04-12 DIAGNOSIS — Z124 Encounter for screening for malignant neoplasm of cervix: Secondary | ICD-10-CM | POA: Insufficient documentation

## 2017-04-12 DIAGNOSIS — M5412 Radiculopathy, cervical region: Secondary | ICD-10-CM | POA: Diagnosis not present

## 2017-04-12 LAB — CBC WITH DIFFERENTIAL/PLATELET
Basophils Absolute: 20 cells/uL (ref 0–200)
Basophils Relative: 0.6 %
EOS ABS: 41 {cells}/uL (ref 15–500)
Eosinophils Relative: 1.2 %
HEMATOCRIT: 37.6 % (ref 35.0–45.0)
HEMOGLOBIN: 13.2 g/dL (ref 11.7–15.5)
LYMPHS ABS: 1159 {cells}/uL (ref 850–3900)
MCH: 33.7 pg — AB (ref 27.0–33.0)
MCHC: 35.1 g/dL (ref 32.0–36.0)
MCV: 95.9 fL (ref 80.0–100.0)
MPV: 9.9 fL (ref 7.5–12.5)
Monocytes Relative: 6.8 %
NEUTROS ABS: 1948 {cells}/uL (ref 1500–7800)
Neutrophils Relative %: 57.3 %
PLATELETS: 131 10*3/uL — AB (ref 140–400)
RBC: 3.92 10*6/uL (ref 3.80–5.10)
RDW: 12.2 % (ref 11.0–15.0)
Total Lymphocyte: 34.1 %
WBC: 3.4 10*3/uL — AB (ref 3.8–10.8)
WBCMIX: 231 {cells}/uL (ref 200–950)

## 2017-04-12 LAB — COMPREHENSIVE METABOLIC PANEL
AG Ratio: 1.7 (calc) (ref 1.0–2.5)
ALBUMIN MSPROF: 4 g/dL (ref 3.6–5.1)
ALKALINE PHOSPHATASE (APISO): 49 U/L (ref 33–130)
ALT: 18 U/L (ref 6–29)
AST: 20 U/L (ref 10–35)
BUN: 14 mg/dL (ref 7–25)
CO2: 28 mmol/L (ref 20–32)
CREATININE: 0.64 mg/dL (ref 0.50–1.05)
Calcium: 8.7 mg/dL (ref 8.6–10.4)
Chloride: 107 mmol/L (ref 98–110)
GLOBULIN: 2.4 g/dL (ref 1.9–3.7)
Glucose, Bld: 82 mg/dL (ref 65–99)
Potassium: 4 mmol/L (ref 3.5–5.3)
Sodium: 140 mmol/L (ref 135–146)
Total Bilirubin: 0.4 mg/dL (ref 0.2–1.2)
Total Protein: 6.4 g/dL (ref 6.1–8.1)

## 2017-04-12 LAB — LIPID PANEL W/REFLEX DIRECT LDL
CHOL/HDL RATIO: 3 (calc) (ref <5.0)
Cholesterol: 148 mg/dL (ref <200)
HDL: 50 mg/dL — AB (ref >50)
LDL Cholesterol (Calc): 70 mg/dL (calc)
NON-HDL CHOLESTEROL (CALC): 98 mg/dL (ref <130)
TRIGLYCERIDES: 201 mg/dL — AB (ref <150)

## 2017-04-12 NOTE — Patient Instructions (Addendum)
High-Fiber Diet Fiber, also called dietary fiber, is a type of carbohydrate found in fruits, vegetables, whole grains, and beans. A high-fiber diet can have many health benefits. Your health care provider may recommend a high-fiber diet to help:  Prevent constipation. Fiber can make your bowel movements more regular.  Lower your cholesterol.  Relieve hemorrhoids, uncomplicated diverticulosis, or irritable bowel syndrome.  Prevent overeating as part of a weight-loss plan.  Prevent heart disease, type 2 diabetes, and certain cancers.  What is my plan? The recommended daily intake of fiber includes:  38 grams for men under age 1.  4 grams for men over age 89.  35 grams for women under age 69.  82 grams for women over age 76.  You can get the recommended daily intake of dietary fiber by eating a variety of fruits, vegetables, grains, and beans. Your health care provider may also recommend a fiber supplement if it is not possible to get enough fiber through your diet. What do I need to know about a high-fiber diet?  Fiber supplements have not been widely studied for their effectiveness, so it is better to get fiber through food sources.  Always check the fiber content on thenutrition facts label of any prepackaged food. Look for foods that contain at least 5 grams of fiber per serving.  Ask your dietitian if you have questions about specific foods that are related to your condition, especially if those foods are not listed in the following section.  Increase your daily fiber consumption gradually. Increasing your intake of dietary fiber too quickly may cause bloating, cramping, or gas.  Drink plenty of water. Water helps you to digest fiber. What foods can I eat? Grains Whole-grain breads. Multigrain cereal. Oats and oatmeal. Brown rice. Barley. Bulgur wheat. [place]. Bran muffins. Popcorn. Rye wafer crackers. Vegetables Sweet potatoes. Spinach. Kale. Artichokes. Cabbage.  Broccoli. Green peas. Carrots. Squash. Fruits Berries. Pears. Apples. Oranges. Avocados. Prunes and raisins. Dried figs. Meats and Other Protein Sources [place], kidney, pinto, and soy beans. Split peas. Lentils. Nuts and seeds. Dairy Fiber-fortified yogurt. Beverages Fiber-fortified soy milk. Fiber-fortified orange juice. Other Fiber bars. The items listed above may not be a complete list of recommended foods or beverages. Contact your dietitian for more options. What foods are not recommended? Grains White bread. Pasta made with refined flour. White rice. Vegetables Fried potatoes. Canned vegetables. Well-cooked vegetables. Fruits Fruit juice. Cooked, strained fruit. Meats and Other Protein Sources Fatty cuts of meat. Fried Sales executive or fried fish. Dairy Milk. Yogurt. Cream cheese. Sour cream. Beverages Soft drinks. Other Cakes and pastries. Butter and oils. The items listed above may not be a complete list of foods and beverages to avoid. Contact your dietitian for more information. What are some tips for including high-fiber foods in my diet?  Eat a wide variety of high-fiber foods.  Make sure that half of all grains consumed each day are whole grains.  Replace breads and cereals made from refined flour or white flour with whole-grain breads and cereals.  Replace white rice with brown rice, bulgur wheat, or millet.  Start the day with a breakfast that is high in fiber, such as a cereal that contains at least 5 grams of fiber per serving.  Use beans in place of meat in soups, salads, or pasta.  Eat high-fiber snacks, such as berries, raw vegetables, nuts, or popcorn. This information is not intended to replace advice given to you by your health care provider. Make sure you discuss any  questions you have with your health care provider. Document Released: 01/01/2005 Document Revised: 06/09/2015 Document Reviewed: 06/16/2013 Elsevier Interactive Patient Education  2018  [employer].   Preventing Osteoporosis, Adult Osteoporosis is a condition that causes the bones to get weaker. With osteoporosis, the bones become thinner, and the normal spaces in bone tissue become larger. This can make the bones weak and cause them to break more easily. People who have osteoporosis are more likely to break their wrist, spine, or hip. Even a minor accident or injury can be enough to break weak bones. Osteoporosis can occur with aging. Your body constantly replaces old bone tissue with new tissue. As you get older, you may lose bone tissue more quickly, or it may be replaced more slowly. Osteoporosis is more likely to develop if you have poor nutrition or do not get enough calcium or vitamin D. Other lifestyle factors can also play a role. By making some diet and lifestyle changes, you can help to keep your bones healthy and help to prevent osteoporosis. What nutrition changes can be made? Nutrition plays an important role in maintaining healthy, strong bones.  Make sure you get enough calcium every day from food or from calcium supplements. ? If you are age 33 or younger, aim to get 1,000 mg of calcium every day. ? If you are older than age 68, aim to get 1,200 mg of calcium every day.  Try to get enough vitamin D every day. ? If you are age 48 or younger, aim to get 600 international units (IU) every day. ? If you are older than age 89, aim to get 800 international units every day.  Follow a healthy diet. Eat plenty of foods that contain calcium and vitamin D. ? Calcium is in milk, cheese, yogurt, and other dairy products. Some fish and vegetables are also good sources of calcium. Many foods such as cereals and breads have had calcium added to them (are fortified). Check nutrition labels to see how much calcium is in a food or drink. ? Foods that contain vitamin D include milk, cereals, salmon, and tuna. Your body also makes vitamin D when you are out in the sun. Bare skin  exposure to the sun on your face, arms, legs, or back for no more than 30 minutes a day, 2 times per week is more than enough. Beyond that, it is important to use sunblock to protect your skin from sunburn, which increases your risk for skin cancer.  What lifestyle changes can be made? Making changes in your everyday life can also play an important role in preventing osteoporosis.  Stay active and get exercise every day. Ask your health care provider what types of exercise are best for you.  Do not use any products that contain nicotine or tobacco, such as cigarettes and e-cigarettes. If you need help quitting, ask your health care provider.  Limit alcohol intake to no more than 1 drink a day for nonpregnant women and 2 drinks a day for men. One drink equals 12 oz of beer, 5 oz of wine, or 1 oz of hard liquor.  Why are these changes important? Making these nutrition and lifestyle changes can:  Help you develop and maintain healthy, strong bones.  Prevent loss of bone mass and the problems that are caused by that loss, such as broken bones and delayed healing.  Make you feel better mentally and physically.  What can happen if changes are not made? Problems that can result from  osteoporosis can be very serious. These may include:  A higher risk of broken bones that are painful and do not heal well.  Physical malformations, such as a collapsed spine or a hunched back.  Problems with movement.  Where to find support: If you need help making changes to prevent osteoporosis, talk with your health care provider. You can ask for a referral to a diet and nutrition specialist (dietitian) and a physical therapist. Where to find more information: Learn more about osteoporosis from:  NIH Osteoporosis and Related [place]: www.niams.GolfingGoddess.com.br  U.S. Office on Women's Health:  SouvenirBaseball.es.html  National Osteoporosis Foundation: ProfilePeek.ch  Summary  Osteoporosis is a condition that causes weak bones that are more likely to break.  Eating a healthy diet and making sure you get enough calcium and vitamin D can help prevent osteoporosis.  Other ways to reduce your risk of osteoporosis include getting regular exercise and avoiding alcohol and products that contain nicotine or tobacco. This information is not intended to replace advice given to you by your health care provider. Make sure you discuss any questions you have with your health care provider. Document Released: 01/16/2015 Document Revised: 09/12/2015 Document Reviewed: 09/12/2015 Elsevier Interactive Patient Education  [employer].

## 2017-04-12 NOTE — Progress Notes (Signed)
HPI:                                                                Shelly Smith is a 52 y.o. female who presents to [facility] today for CPE with Pap smear  Current Concerns: none    GYN/Sexual Health  Obstetrics: G0P0  Menstrual status: amenorrhea 2/2 SERM  LMP: 11/2016  Menses: irregular, SERM  Last pap smear: 2016, [facility], normal per patient  History of abnormal pap smears: no  Sexually active: yes  Current contraception:  History of STI: no  Depression screen [facility] 2/9 04/12/2017 03/09/2016 12/13/2015  Decreased Interest 0 0 0  Down, Depressed, Hopeless 0 0 0  PHQ - 2 Score 0 0 0     Health Maintenance Health Maintenance  Topic Date Due  . HIV Screening  11/18/1980  . TETANUS/TDAP  11/18/1984  . INFLUENZA VACCINE  12/13/2017 (Originally 08/15/2016)  . MAMMOGRAM  10/12/2017  . PAP SMEAR  11/18/2017  . Fecal DNA (Cologuard)  05/02/2019    Past Medical History:  Diagnosis Date  . Breast cancer of lower-inner quadrant of right female breast ([place]) 10/10/2015  . History of bronchitis   . History of radiation therapy 12/27/15- 02/08/16   Right Chest Wall, Axilla, SCV treated to 50 Gy in 25 fractions, and IM nodes to >45 Gy in 25 fractions, and then chest wall scar boosted an additional 10 Gy in 5 fractions.   Past Surgical History:  Procedure Laterality Date  . MASTECTOMY W/ SENTINEL NODE BIOPSY Right 11/21/2015   Procedure: RIGHT TOTAL MASTECTOMY WITH RIGHT AXILLARY SENTINEL LYMPH NODE BIOPSY;  Surgeon: Rolm Bookbinder, MD;  Location: [facility];  Service: General;  Laterality: Right;   Social History   Tobacco Use  . Smoking status: Never Smoker  . Smokeless tobacco: Never Used  Substance Use Topics  . Alcohol use: Yes    Alcohol/week: 0.6 oz    Types: 1 Standard drinks or equivalent per week   family history includes Cancer in her cousin; Hypertension in her father and mother; Stroke in her  father.  ROS: negative except as noted in the HPI  Medications: Current Outpatient Medications  Medication Sig Dispense Refill  . cholecalciferol (VITAMIN D) 1000 units tablet Take 1,000 Units by mouth daily.    . Multiple Vitamin (MULTIVITAMIN WITH MINERALS) TABS tablet Take 1 tablet by mouth daily.    . Probiotic Product (PROBIOTIC ADVANCED PO) Take by mouth.    . tamoxifen (NOLVADEX) 20 MG tablet Take 1 tablet (20 mg total) by mouth daily. 30 tablet 11   No current facility-administered medications for this visit.    Allergies  Allergen Reactions  . No Known Allergies        Objective:  BP 106/70   Pulse 73   Resp 14   Wt 108 lb (49 kg)   LMP 11/15/2016 (Approximate) Comment: Tamoxifen  BMI 18.54 kg/m  General Appearance:  Alert, cooperative, no distress, appropriate for age                            Head:  Normocephalic, without obvious abnormality  Eyes:  PERRL, EOM's intact, conjunctiva and cornea clear, wearing glasses                             Ears:  TM pearly gray color and semitransparent, external ear canals normal, both ears                            Nose:  Nares symmetrical                          Throat:  Lips, tongue, and mucosa are moist, pink, and intact; poor dentition                             Neck:  Supple; symmetrical, trachea midline, no adenopathy; thyroid: no enlargement, symmetric, no tenderness/mass/nodules                             Back:  Symmetrical, no curvature, ROM normal               Chest/Breast:  deferred                           Lungs:  Clear to auscultation bilaterally, respirations unlabored                             Heart:  regular rate & normal rhythm, S1 and S2 normal, no murmurs, rubs, or gallops                     Abdomen:  Soft, non-tender, no mass or organomegaly              Genitourinary:  vulva without rashes or lesions, normal introitus and urethral meatus, vaginal mucosa without  erythema, normal discharge, cervix non-friable without lesions         Musculoskeletal:  Tone and strength strong and symmetrical, all extremities; no joint pain or edema, normal gait and station                                      Lymphatic:  No adenopathy             Skin/Hair/Nails:  Skin warm, dry and intact, no rashes or abnormal dyspigmentation on limited exam                   Neurologic:  Alert and oriented x3, no cranial nerve deficits, DTR's intact, sensation grossly intact, normal gait and station, no tremor Psych: well-groomed, cooperative, good eye contact, euthymic mood, affect mood-congruent, speech is articulate, and thought processes clear and goal-directed  A chaperone was used for the GU portion of the exam, Izell [place], CMA.    No results found for this or any previous visit (from the past 72 hour(s)). No results found.    Assessment and Plan: 52 y.o. female with  Encounter for annual physical exam  Encounter for Pap smear of cervix with HPV DNA cotesting - Plan: Cytology - PAP  Cervical radiculopathy - followed by chiro, PT did not help  Long term curr use of selective estrogen receptor modulators (SERMs)  Need for Tdap vaccination - Plan: Tdap vaccine greater than or equal to 7yo IM  Health maintenance UTD She has follow-up with her oncologist today, who is performing breast exams and mammogram for cancer surveillance Non-fasting labs pending PHQ2 negative Tdap given today  Patient education and anticipatory guidance given Patient agrees with treatment plan Follow-up based on Pap results or sooner as needed  Darlyne Russian PA-C

## 2017-04-12 NOTE — Progress Notes (Signed)
[place]  [phone] [phone]  Clinic follow up Note   Patient Care Team: Trixie Dredge, PA-C as PCP - General (Physician Assistant) Kyung Rudd, MD as Consulting Physician (Radiation Oncology) Truitt Merle, MD as Consulting Physician (Hematology) Erroll Luna, MD as Consulting Physician (General Surgery) Gardenia Phlegm, NP as Nurse Practitioner (Hematology and Oncology) 04/15/2017  CHIEF COMPLAINT:  Follow up right breast cancer  Oncology History   Breast cancer of lower-inner quadrant of right female breast [facility])   Staging form: Breast, AJCC 7th Edition   - Clinical stage from 10/04/2015: Stage IA (T1c, N0, M0) - Signed by Truitt Merle, MD on 10/12/2015   - Pathologic stage from 11/21/2015: Stage IIA (T1c, N1a, cM0) - Signed by Truitt Merle, MD on 12/12/2015      History of right breast cancer   10/04/2015 Mammogram    Diagnostic mammogram and US showed a 1.6cm lobulated irregular high density mass in the lower inner quadrant right breast, possible chest wall invasion and skin involvement, right axillar [place] (-)      10/05/2015 Initial Diagnosis    Breast cancer of lower-inner quadrant of right female breast ([place])      10/05/2015 Initial Biopsy    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       10/05/2015 Receptors her2    ER 90% +, PR 95%+, strong staining, Her2-, Ki67 10%      10/05/2015 Oncotype testing    Oncotype recurrence score of 19, intermediate risk, predicts 10 year distant recurrence risk of 12% with tamoxifen.      10/13/2015 Imaging    Bilateral breast MRI with and without contrast showed the known 1.7 x 1.7 x 1.0 cm enhancing mass, and a 2.0 x 1.4 x 3.6 cm area of non-mass enhancement. Left breast was negative. Lymph nodes appear normal.      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 Donne Hazel       11/21/2015 Pathology Results    Right breast mastectomy showed 1.6cm invasive ductal carcinoma, G1, (+) LVI, (+) perineural invasion, one SLN was positive, margins were negative       12/27/2015 - 02/08/2016 Radiation Therapy    Adjuvant radiation to right Chest Wall, Axilla, SCV treated to 50 Gy in 25 fractions, and IM nodes to >45 Gy in 25 fractions, and then chest wall scar boosted an additional 10 Gy in 5 fractions.       HISTORY OF PRESENTING ILLNESS:  Shelly Smith 52 y.o. female is here because of Her recently diagnosed right breast cancer. She is accompanied by her husband to my clinic today.  She noticed a small right breast nodule below the nipple in the summer 2016, no nipple discharge, skin change, pain or other symptoms. She saw her gynecologist a few months later, and breast exam did confirm the breast nodule. Mammogram was ordered, but was not scheduled. She noticed some in skin retraction in the area in earlier this year. She finally scheduled for her mammogram a few weeks ago, which showed a 1.6 cm lobulated mass in the right breast lower inner quadrant. She underwent ultrasound-guided core needle biopsy the next  day, which showed invasive lobular carcinoma, ER/PR strongly positive, HER-2 negative. She was referred to [place] for further management.  She feels well overall. No pain, or other discomfort. She denies any weight loss, change of appetite or energy level lately. She is to have mammograms every other year, no family history of breast cancer or personal history of breast disease or biopsy. She is married, lives with her husband, the relocated from [place] to [place] last spring. She has 2 adult daughters. She is originally from [place].   GYN HISTORY  Menarchal: 13 LMP: 08/2015 Contraceptive: no  HRT: no    G2P2: two daughters 64 and 77 yo   CURRENT THERAPY: Tamoxifen 20 mg once daily, started on 10/13/2015  INTERIM HISTORY:  Shelly Smith returns for follow-up of her right breast cancer. She presents to the clinic today by herself. She reports she is doing well overall.  She developed some numbness on the right thumb and second finger, was evaluated by physical therapist, Dr Durward Fortes, and her chiropractic therapist.  Her symptoms has resolved.  She has noticed some weight gain, she has gained about 7 pounds since September 2018.  She is not very physically active, but does dance practice twice a week.  She otherwise, is tolerating tamoxifen very well, no hot flash, joint pain, or other complaints.  She feels well overall denies any pain, abdominal discomfort or other symptoms.  MEDICAL HISTORY:  Past Medical History:  Diagnosis Date  . Breast cancer of lower-inner quadrant of right female breast ([place]) 10/10/2015  . History of bronchitis   . History of radiation therapy 12/27/15- 02/08/16   Right Chest Wall, Axilla, SCV treated to 50 Gy in 25 fractions, and IM nodes to >45 Gy in 25 fractions, and then chest wall scar boosted an additional 10 Gy in 5 fractions.    SURGICAL HISTORY: Past Surgical History:  Procedure Laterality Date  . MASTECTOMY W/ SENTINEL NODE BIOPSY Right 11/21/2015   Procedure: RIGHT TOTAL MASTECTOMY WITH RIGHT AXILLARY SENTINEL LYMPH NODE BIOPSY;  Surgeon: Rolm Bookbinder, MD;  Location: [place];  Service: General;  Laterality: Right;    SOCIAL HISTORY: Social History   Socioeconomic History  . Marital status: Married    Spouse name: Not on file  . Number of children: Not on file  . Years of education: Not on file  . Highest education level: Not on file  Occupational History  . Not on file  Social Needs  . Financial resource strain: Not on file  . Food insecurity:    Worry: Not on file    Inability: Not on file  . Transportation needs:    Medical: Not on file     Non-medical: Not on file  Tobacco Use  . Smoking status: Never Smoker  . Smokeless tobacco: Never Used  Substance and Sexual Activity  . Alcohol use: Yes    Alcohol/week: 0.6 oz    Types: 1 Standard drinks or equivalent per week  . Drug use: No  . Sexual activity: Yes    Birth control/protection: Other-see comments  Lifestyle  . Physical activity:    Days per week: Not on file    Minutes per session: Not on file  . Stress: Not on file  Relationships  . Social connections:    Talks on phone: Not on file    Gets together: Not on file    Attends religious service: Not on file    Active member of club or organization: Not on file  Attends meetings of clubs or organizations: Not on file    Relationship status: Not on file  . Intimate partner violence:    Fear of current or ex partner: Not on file    Emotionally abused: Not on file    Physically abused: Not on file    Forced sexual activity: Not on file  Other Topics Concern  . Not on file  Social History Narrative  . Not on file    FAMILY HISTORY: Family History  Problem Relation Age of Onset  . Hypertension Mother   . Hypertension Father   . Stroke Father   . Cancer Cousin        brain tumor     ALLERGIES:  is allergic to no known allergies.  MEDICATIONS:  Current Outpatient Medications  Medication Sig Dispense Refill  . cholecalciferol (VITAMIN D) 1000 units tablet Take 1,000 Units by mouth daily.    . Multiple Vitamin (MULTIVITAMIN WITH MINERALS) TABS tablet Take 1 tablet by mouth daily.    . Probiotic Product (PROBIOTIC ADVANCED PO) Take by mouth.    . tamoxifen (NOLVADEX) 20 MG tablet Take 1 tablet (20 mg total) by mouth daily. 30 tablet 11   No current facility-administered medications for this visit.     REVIEW OF SYSTEMS:   Constitutional: Denies fevers, chills or abnormal night sweats Eyes: Denies blurriness of vision, double vision or watery eyes Ears, nose, mouth, throat, and face: Denies mucositis  or sore throat Respiratory: Denies cough, dyspnea or wheezes Cardiovascular: Denies palpitation, chest discomfort or lower extremity swelling Gastrointestinal:  Denies nausea, heartburn or change in bowel habits Skin: Denies abnormal skin rashes Lymphatics: Denies new lymphadenopathy or easy bruising Neurological:Denies numbness, tingling or new weaknesses Behavioral/Psych: Mood is stable, no new changes  Musculoskeletal: Negative All other systems were reviewed with the patient and are negative.  PHYSICAL EXAMINATION: ECOG PERFORMANCE STATUS: 0 - Asymptomatic  Vitals:   04/15/17 0848  BP: 101/72  Pulse: 72  Resp: 18  Temp: 97.6 F (36.4 C)  SpO2: 98%   Filed Weights   04/15/17 0848  Weight: 107 lb 11.2 oz (48.9 kg)     GENERAL:alert, no distress and comfortable SKIN: skin color, texture, turgor are normal, no rashes or significant lesions  EYES: normal, conjunctiva are pink and non-injected, sclera clear OROPHARYNX:no exudate, no erythema and lips, buccal mucosa, and tongue normal  NECK: supple, thyroid normal size, non-tender, without nodularity LYMPH:  no palpable lymphadenopathy in the cervical, axillary or inguinal LUNGS: clear to auscultation and percussion with normal breathing effort HEART: regular rate & rhythm and no murmurs and no lower extremity edema ABDOMEN:abdomen soft, non-tender and normal bowel sounds, no hepatomegaly  Musculoskeletal:no cyanosis of digits and no clubbing (+) tightness around right axillary tendon   PSYCH: alert & oriented x 3 with fluent speech NEURO: no focal motor/sensory deficits Breasts: Breast inspection showed Right breast is surgically absent, incision has well healed. Some mild skin pigmentation changes from radiation. Exam of left breast and bilateral axillas showed no palpable mass or adenopathy with slight right axillary swelling   LABORATORY DATA:  I have reviewed the data as listed CBC Latest Ref Rng & Units 04/12/2017  10/05/2016 06/13/2016  WBC 3.8 - 10.8 Thousand/uL 3.4(L) 3.0(L) 3.0(L)  Hemoglobin 11.7 - 15.5 g/dL 13.2 13.5 13.7  Hematocrit 35.0 - 45.0 % 37.6 38.8 39.6  Platelets 140 - 400 Thousand/uL 131(L) 116(L) 121(L)   CMP Latest Ref Rng & Units 04/12/2017 10/05/2016 06/13/2016  Glucose 65 -  99 mg/dL 82 98 106  BUN 7 - 25 mg/dL 14 11.3 12.4  Creatinine 0.50 - 1.05 mg/dL 0.64 0.7 0.8  Sodium 135 - 146 mmol/L 140 140 141  Potassium 3.5 - 5.3 mmol/L 4.0 3.9 3.9  Chloride 98 - 110 mmol/L 107 - -  CO2 20 - 32 mmol/L _0 Calcium 8.6 - 10.4 mg/dL 8.7 8.9 9.0  Total Protein 6.1 - 8.1 g/dL 6.4 6.6 6.8  Total Bilirubin 0.2 - 1.2 mg/dL 0.4 0.37 0.30  Alkaline Phos 40 - 150 U/L - 54 56  AST 10 - 35 U/L _1 ALT 6 - 29 U/L _2 PATHOLOGY REPORT  Diagnosis 11/02/2015 Breast, right, needle core biopsy, lateral - 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 - SEE COMMENT. Microscopic Comment There is a small focus of atypical ductal hyperplasia, supported by a positive stain for E-Cadherin and a negative stain for cytokeratin 5/6. The results were called to the [place] on 11/03/2015  Diagnosis 10/05/2015 Breast, right, needle core biopsy INVASIVE DUCTAL CARCINOMA, GRADE 2 Results: IMMUNOHISTOCHEMICAL AND MORPHOMETRIC ANALYSIS PERFORMED MANUALLY Estrogen Receptor: 90%, POSITIVE, STRONG STAINING INTENSITY Progesterone Receptor: 95%, POSITIVE, STRONG STAINING INTENSITY Proliferation Marker Ki67: 10% Results: HER2 - NEGATIVE RATIO OF HER2/CEP17 SIGNALS 1.42 AVERAGE HER2 COPY NUMBER PER CELL 2.20  ONCOTYPE DX RS 19, predicts 10 year distance recurrence risk of 12% with tamoxifen   Diagnosis 11/21/2015 1. Breast, simple mastectomy, Right - INVASIVE DUCTAL CARCINOMA GRADE I/III, SPANNING 1.6 CM. - DUCTAL CARICNOMA IN SITU, INTERMEDIATE GRADE. - LYMPHOVASCULAR INVASION IS IDENTIFIED. - PERINEURAL INVASION IS  IDENTIFIED. - INVASIVE CARCINOMA IS FOCALLY 0.1 CM TO THE ANTERIOR MARGIN. - SEE ONCOLOGY TABLE BELOW. 2. Lymph node, sentinel, biopsy, Right Axillary - METASTATIC CARCINOMA IN 1 OF 1 LYMPH NODE (1/1). Microscopic Comment 1. BREAST, INVASIVE TUMOR, WITH LYMPH NODES PRESENT Specimen, including laterality and lymph node sampling (sentinel, non-sentinel): Right breast and right axillary lymph node. Procedure: Simple mastectomy and one lymph node resection. Histologic type: Ductal Grade: I Tubule formation: 2 Nuclear pleomorphism: 2 Mitotic:1 Tumor size (gross measurement): 1.6 cm Margins: Invasive, distance to closest margin: Focally 0.1 cm to the anterior margin. Lymphovascular invasion: Present. Ductal carcinoma in situ: Present. Grade: Intermediate. Extensive intraductal component: Not identified. Lobular neoplasia: Not identified. Tumor focality: Unifocal Extent of tumor: Confined to breast parenchyma. Lymph nodes: 1 of 3 FINAL for DEMISHA, NOKES ([id number]) Microscopic Comment(continued) Examined: 1 Sentinel 0 Non-sentinel 1 Total Lymph nodes with metastasis: 1 Isolated tumor cells (< 0.2 mm): 0 Micrometastasis: (> 0.2 mm and < 2.0 mm): 0 Macrometastasis: (> 2.0 mm): 1 Extracapsular extension: Not identified. Breast prognostic profile: Case [phone] Estrogen receptor: 90%, strong Progesterone receptor: 95%, strong. Her 2 neu: No amplification was detected. The ratio was 1.42. Ki-67: 10% Non-neoplastic breast: Fibrocystic changes, pseudoangiomatous stromal hyperplasia (PASH), and healing biopsy sites. TNM: pT1c, pN1a Comments: There are two lesions containing biopsy clips. The 1.6 cm lesion in the mid/inferior lateral specimen reveals grade I invasive ductal carcinoma. This tumor is focally 0.1 cm to the anterior margin. In addition, there is a 0.8 cm vaguely nodular lesion present lateral to this larger mass, containing a dumbbell shaped clip. Histologic evaluation  of this area reveals benign fibrocystic changes and pseudoangiomatous stromal hyperplasia with a healing biopsy site. No carcinoma is identified here. (JBK:gt, 11/22/15)  RADIOGRAPHIC STUDIES: I have personally reviewed the radiological images as listed and agreed with the findings  in the report.  Diagnostic Mammogram Left Breast 10/04/16 at [place] There is no mammographic evidence of malignancy. Routine mammographic evaluation in 1 year is recommended.  This exam was interpreted at the [facility] location.  MRI Breast b/l 10/13/2015 IMPRESSION: Irregular enhancing mass within the lower outer right breast compatible with recently biopsied right breast carcinoma. There a few small spicules of this mass extending posteriorly toward the pectoralis muscle. No definitive pectoralis muscle enhancement to suggest invasion identified on current examination. Within the lateral aspect of the right breast there is a 3.6 cm area of suspicious non mass enhancement.  ASSESSMENT & PLAN:  52 y.o. premenopausal [place] woman, presented with a palpable right breast mass for one year.  1. Breast cancer of the lower inner quadrant of right female breast, pT1cN1aM0, stage IIA, ER/PR strongly positive, HER-2 negative, grade 2, Oncotype RS 19 --We previously discussed her surgical pathology findings with patient in details. -She had a complete surgical resection with negative margins, unfortunately the 1 sentinel lymph nodes was positive.  -the Oncotype Dx was done on her initial biopsy before surgery, the result was reviewed with her in details. She has intermedia risk based on the recurrence score, which predicts 10 year distant recurrence after 5 years of tamoxifen 12%. The benefit of chemotherapy in the intermedia risk group is small and controversial. Given her relatively low score in the intermedia group, I did not recommend adjuvant chemotherapy. She agrees with the plan -Her surgical path showed  low grade tumor, low KI67, I do not think she needs mammaprint, which will likely be low risk disease if we did  -She started tamoxifen before surgery, has been tolerating well, without significant side effects. We'll continue for 10 years. The alternative option of switching her to aromatase inhibitor after she became postmenopause was also discussed with her previously.  Her last menstrual period was 4 months ago, she is perimenopausal, will check her Hostetter level when she has no period for more than a year  -I previously encouraged her to continue healthy diet and exercise regularly. She dances twice a week, I encouraged her to be more physically active, -She will continue follow-up with her primary care physician once a year for physical, including cholesterol, glucose test  -She is clinically doing well, lab results CBC and CMP normal except her mild leukopenia and thrombocytopenia. Breast exam was unremarkable, Mammogram from 10/04/16 was normal except she has "D" dense breast. No clinical concern for recurrence. -Continue breast cancer surveillance  -F/u in 6 months   2. Mild leukopenia and thrombocytopenia  -started after tamoxifen, intermittent, likely secondary to tamoxifen  -overall stable and mild, we'll continue monitoring.   3. Right hand neuropathy  -She developed numbness on her right thumb and second finger in February 2019, resolved now, probably not related to breast surgery or Tamoxifen  4. Weight gain -She has gained 8-9 pounds since she started tamoxifen, likely related to medication.  She is not overweight, her triglyceride level has been slightly elevated. -I encouraged her to eat healthy, reduce carbohydrate intake, and exercise more.  She is willing to try.   Plan -Continue tamoxifen -Lab and follow-up in 6 months  -Diagnostic left breast mammogram at [facility] in September 2019  All questions were answered. The patient knows to call the clinic with any problems, questions  or concerns.  I spent 20 minutes counseling the patient face to face. The total time spent in the appointment was 25 minutes and more than 50% was  on counseling.  This document serves as a record of services personally performed by Truitt Merle, MD. It was created on her behalf by Theresia Bough, a trained medical scribe. The creation of this record is based on the scribe's personal observations and the provider's statements to them.   I have reviewed the above documentation for accuracy and completeness, and I agree with the above.   Truitt Merle  04/15/2017 9:16 AM

## 2017-04-15 ENCOUNTER — Encounter: Payer: Self-pay | Admitting: Hematology

## 2017-04-15 ENCOUNTER — Inpatient Hospital Stay: Payer: 59 | Attending: Hematology | Admitting: Hematology

## 2017-04-15 ENCOUNTER — Telehealth: Payer: Self-pay

## 2017-04-15 VITALS — BP 101/72 | HR 72 | Temp 97.6°F | Resp 18 | Ht 64.0 in | Wt 107.7 lb

## 2017-04-15 DIAGNOSIS — Z79899 Other long term (current) drug therapy: Secondary | ICD-10-CM | POA: Insufficient documentation

## 2017-04-15 DIAGNOSIS — D696 Thrombocytopenia, unspecified: Secondary | ICD-10-CM | POA: Diagnosis not present

## 2017-04-15 DIAGNOSIS — Z79811 Long term (current) use of aromatase inhibitors: Secondary | ICD-10-CM | POA: Diagnosis not present

## 2017-04-15 DIAGNOSIS — Z170 Estrogen receptor positive status [ER+]: Secondary | ICD-10-CM | POA: Diagnosis not present

## 2017-04-15 DIAGNOSIS — G629 Polyneuropathy, unspecified: Secondary | ICD-10-CM

## 2017-04-15 DIAGNOSIS — D72819 Decreased white blood cell count, unspecified: Secondary | ICD-10-CM | POA: Insufficient documentation

## 2017-04-15 DIAGNOSIS — Z1321 Encounter for screening for nutritional disorder: Secondary | ICD-10-CM

## 2017-04-15 DIAGNOSIS — Z853 Personal history of malignant neoplasm of breast: Secondary | ICD-10-CM

## 2017-04-15 DIAGNOSIS — C50311 Malignant neoplasm of lower-inner quadrant of right female breast: Secondary | ICD-10-CM | POA: Diagnosis not present

## 2017-04-15 LAB — CYTOLOGY - PAP
DIAGNOSIS: NEGATIVE
HPV (WINDOPATH): NOT DETECTED

## 2017-04-15 NOTE — Addendum Note (Signed)
Addended by: Truitt Merle on: 04/15/2017 08:24 PM   Modules accepted: Orders

## 2017-04-15 NOTE — Telephone Encounter (Signed)
Printed avs and calender of upcoming appointment in 6 mon. Per 4/1 los

## 2017-04-16 NOTE — Progress Notes (Signed)
Pap smear negative. Recommend repeat Pap in 5 years Labs look great - normal kidney function - cholesterol in a healthy range - normal blood counts, no anemia - no evidence of diabetes

## 2017-04-29 ENCOUNTER — Ambulatory Visit (INDEPENDENT_AMBULATORY_CARE_PROVIDER_SITE_OTHER): Payer: 59 | Admitting: Physician Assistant

## 2017-04-29 ENCOUNTER — Encounter: Payer: Self-pay | Admitting: Physician Assistant

## 2017-04-29 VITALS — BP 111/73 | HR 65 | Temp 98.0°F | Wt 107.0 lb

## 2017-04-29 DIAGNOSIS — H8111 Benign paroxysmal vertigo, right ear: Secondary | ICD-10-CM | POA: Diagnosis not present

## 2017-04-29 DIAGNOSIS — R110 Nausea: Secondary | ICD-10-CM

## 2017-04-29 MED ORDER — ONDANSETRON 4 MG PO TBDP: 4.0000 mg | ORAL_TABLET | Freq: Three times a day (TID) | ORAL | 0 refills | Status: DC | PRN

## 2017-04-29 NOTE — Progress Notes (Signed)
HPI:                                                                Jessi Pitstick is a 52 y.o. female who presents to [facility] [place] today for dizziness  Dizziness  This is a new problem. The current episode started yesterday. The problem occurs intermittently. The problem has been unchanged. Associated symptoms include nausea and vertigo. Pertinent negatives include no fever or visual change. Exacerbated by: head movements to the right, laying down. She has tried rest for the symptoms.  Sudden onset dizziness yesterday afternoon described as "everything moving." Episode lasted <1 minute and resolved spontaneously. Reports rolling on right side in bed last night and had intense dizziness associated with nausea. pertinent negatives:  staggering or ataxic gait, vomiting, headache, double vision, visual loss, slurred speech, numbness of the face or body, weakness, clumsiness, or incoordination, syncope  No flowsheet data found.    Past Medical History:  Diagnosis Date  . Breast cancer of lower-inner quadrant of right female breast ([place]) 10/10/2015  . History of bronchitis   . History of radiation therapy 12/27/15- 02/08/16   Right Chest Wall, Axilla, SCV treated to 50 Gy in 25 fractions, and IM nodes to >45 Gy in 25 fractions, and then chest wall scar boosted an additional 10 Gy in 5 fractions.   Past Surgical History:  Procedure Laterality Date  . MASTECTOMY W/ SENTINEL NODE BIOPSY Right 11/21/2015   Procedure: RIGHT TOTAL MASTECTOMY WITH RIGHT AXILLARY SENTINEL LYMPH NODE BIOPSY;  Surgeon: Rolm Bookbinder, MD;  Location: [place];  Service: General;  Laterality: Right;   Social History   Tobacco Use  . Smoking status: Never Smoker  . Smokeless tobacco: Never Used  Substance Use Topics  . Alcohol use: Yes    Alcohol/week: 0.6 oz    Types: 1 Standard drinks or equivalent per week   family history includes Cancer in her cousin; Hypertension in  her father and mother; Stroke in her father.    ROS: negative except as noted in the HPI  Medications: Current Outpatient Medications  Medication Sig Dispense Refill  . cholecalciferol (VITAMIN D) 1000 units tablet Take 1,000 Units by mouth daily.    . Multiple Vitamin (MULTIVITAMIN WITH MINERALS) TABS tablet Take 1 tablet by mouth daily.    . Probiotic Product (PROBIOTIC ADVANCED PO) Take by mouth.    . tamoxifen (NOLVADEX) 20 MG tablet Take 1 tablet (20 mg total) by mouth daily. 30 tablet 11   No current facility-administered medications for this visit.    Allergies  Allergen Reactions  . No Known Allergies        Objective:  BP 111/73   Pulse 65   Temp 98 F (36.7 C) (Oral)   Wt 107 lb (48.5 kg)   BMI 18.37 kg/m  Gen: well-groomed, not ill-appearing, no acute distress HEENT: head normocephalic, atraumatic; conjunctiva and cornea clear, oropharynx clear, moist mucus membranes; neck supple, no meningeal signs Pulm: Normal work of breathing, normal phonation Neuro:  cranial nerves II-XII intact, normal finger-to-nose, normal heel-to-shin, negative pronator drift, normal rapid alternating movements, DTR's intact, normal tone, no tremor, positive Dix Hallpike with horizontal nystagmus on the right side MSK: strength 5/5 and symmetric in bilateral upper  and lower extremities, normal gait and station, negative Romberg Mental Status: alert and oriented x 3, speech articulate, and thought processes clear and goal-directed    Orthostatic VS for the past 24 hrs:  BP- Lying Pulse- Lying BP- Sitting Pulse- Sitting BP- Standing at 0 minutes Pulse- Standing at 0 minutes  04/29/17 1630 99/66 67 110/74 63 106/74 80      No results found for this or any previous visit (from the past 72 hour(s)). No results found.    Assessment and Plan: 52 y.o. female with   1. BPPV (benign paroxysmal positional vertigo), right - new onset, episodic, positional vertiginous symptoms lasting  <1 minute with positive Marye Round with horizontal nystagmus on the right side. Otherwise normal neuro exam - counseled on home Epley maneuver tid - will refer to vestibular rehab if no improvement or worsening in 1-2 weeks  2. Nausea without vomiting - ondansetron (ZOFRAN-ODT) 4 MG disintegrating tablet; Take 1 tablet (4 mg total) by mouth every 8 (eight) hours as needed for nausea or vomiting.  Dispense: 16 tablet; Refill: 0   Patient education and anticipatory guidance given Patient agrees with treatment plan Follow-up as needed if symptoms worsen or fail to improve  Darlyne Russian PA-C

## 2017-04-29 NOTE — Patient Instructions (Signed)

## 2017-10-07 DIAGNOSIS — Z1231 Encounter for screening mammogram for malignant neoplasm of breast: Secondary | ICD-10-CM | POA: Diagnosis not present

## 2017-10-13 NOTE — Progress Notes (Signed)
[place]  [phone] [phone]  Clinic follow up Note   Patient Care Team: Trixie Dredge, PA-C as PCP - General (Physician Assistant) Kyung Rudd, MD as Consulting Physician (Radiation Oncology) Truitt Merle, MD as Consulting Physician (Hematology) Erroll Luna, MD as Consulting Physician (General Surgery) Gardenia Phlegm, NP as Nurse Practitioner (Hematology and Oncology)   Date of Service:  10/14/2017  CHIEF COMPLAINT:  Follow up right breast cancer  Oncology History   Breast cancer of lower-inner quadrant of right female breast [place])   Staging form: Breast, AJCC 7th Edition   - Clinical stage from 10/04/2015: Stage IA (T1c, N0, M0) - Signed by Truitt Merle, MD on 10/12/2015   - Pathologic stage from 11/21/2015: Stage IIA (T1c, N1a, cM0) - Signed by Truitt Merle, MD on 12/12/2015      History of right breast cancer   10/04/2015 Mammogram    Diagnostic mammogram and US showed a 1.6cm lobulated irregular high density mass in the lower inner quadrant right breast, possible chest wall invasion and skin involvement, right axillar [place] (-)    10/05/2015 Initial Diagnosis    Breast cancer of lower-inner quadrant of right female breast ([place])    10/05/2015 Initial Biopsy    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     10/05/2015 Receptors her2    ER 90% +, PR 95%+, strong staining, Her2-, Ki67 10%    10/05/2015 Oncotype testing    Oncotype recurrence score of 19, intermediate risk, predicts 10 year distant recurrence risk of 12% with tamoxifen.    10/13/2015 Imaging    Bilateral breast MRI with and without contrast showed the known 1.7 x 1.7 x 1.0 cm enhancing mass, and a 2.0 x 1.4 x 3.6 cm area of non-mass enhancement. Left breast was negative. Lymph nodes appear normal.    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 Donne Hazel     11/21/2015 Pathology Results    Right breast mastectomy showed 1.6cm invasive ductal carcinoma, G1, (+) LVI, (+) perineural invasion, one SLN was positive, margins were negative     12/27/2015 - 02/08/2016 Radiation Therapy    Adjuvant radiation to right Chest Wall, Axilla, SCV treated to 50 Gy in 25 fractions, and IM nodes to >45 Gy in 25 fractions, and then chest wall scar boosted an additional 10 Gy in 5 fractions.     HISTORY OF PRESENTING ILLNESS:  [place] 52 y.o. female is here because of Her recently diagnosed right breast cancer. She is accompanied by her husband to my clinic today.  She noticed a small right breast nodule below the nipple in the summer 2016, no nipple discharge, skin change, pain or other symptoms. She saw her gynecologist a few months later, and breast exam did confirm the breast nodule. Mammogram was ordered, but was not scheduled. She noticed some in skin retraction in the area in earlier this year. She finally scheduled for her mammogram a few weeks ago, which showed a 1.6 cm lobulated mass in the right breast lower inner quadrant. She underwent ultrasound-guided core needle biopsy the next day, which showed invasive lobular carcinoma, ER/PR strongly positive, HER-2 negative. She was referred to [place]  for further management.  She feels well overall. No pain, or other discomfort. She denies any weight loss, change of appetite or energy level lately. She is to have mammograms every other year, no family history of breast cancer or personal history of breast disease or biopsy. She is married, lives with her husband, the relocated from [place] to [place] last spring. She has 2 adult daughters. She is originally from [place].   GYN HISTORY  Menarchal: 13 LMP: 08/2015 Contraceptive: no  HRT: no    G2P2: two daughters 9 and 44 yo   CURRENT THERAPY: Tamoxifen 20 mg once daily, started on 10/13/2015  INTERIM HISTORY:   [name] returns for follow-up of her right breast cancer. She was last seen by me 6 months ago. She presents to the clinic today by herself.  She is doing very well, denies any significant pain, hot flash, joint pain, or other new symptoms.  She has good appetite and energy level, she remains to be physically active.  She had last menstrual period in November 2018.  She does have a customized right arm sleeve, but does not use regularly due to the tightness.  No lymphedema.   MEDICAL HISTORY:  Past Medical History:  Diagnosis Date  . Breast cancer of lower-inner quadrant of right female breast ([place]) 10/10/2015  . History of bronchitis   . History of radiation therapy 12/27/15- 02/08/16   Right Chest Wall, Axilla, SCV treated to 50 Gy in 25 fractions, and IM nodes to >45 Gy in 25 fractions, and then chest wall scar boosted an additional 10 Gy in 5 fractions.    SURGICAL HISTORY: Past Surgical History:  Procedure Laterality Date  . MASTECTOMY W/ SENTINEL NODE BIOPSY Right 11/21/2015   Procedure: RIGHT TOTAL MASTECTOMY WITH RIGHT AXILLARY SENTINEL LYMPH NODE BIOPSY;  Surgeon: Shelly Bookbinder, MD;  Location: [facility];  Service: General;  Laterality: Right;    SOCIAL HISTORY: Social History   Socioeconomic History  . Marital status: Married    Spouse name: Not on file  . Number of children: Not on file  . Years of education: Not on file  . Highest education level: Not on file  Occupational History  . Not on file  Social Needs  . Financial resource strain: Not on file  . Food insecurity:    Worry: Not on file    Inability: Not on file  . Transportation needs:    Medical: Not on file    Non-medical: Not on file  Tobacco Use  . Smoking status: Never Smoker  . Smokeless tobacco: Never Used  Substance and Sexual Activity  . Alcohol use: Yes    Alcohol/week:  1.0 standard drinks    Types: 1 Standard drinks or equivalent per week  . Drug use: No  . Sexual activity: Yes    Birth control/protection: Other-see comments  Lifestyle  . Physical activity:    Days per week: Not on file    Minutes per session: Not on file  . Stress: Not on file  Relationships  . Social connections:    Talks on phone: Not on file    Gets together: Not on file    Attends religious service: Not on file    Active member of club or organization: Not on file    Attends meetings of clubs or organizations: Not on file    Relationship status: Not on file  . Intimate partner violence:    Fear of current or ex partner: Not on  file    Emotionally abused: Not on file    Physically abused: Not on file    Forced sexual activity: Not on file  Other Topics Concern  . Not on file  Social History Narrative  . Not on file    FAMILY HISTORY: Family History  Problem Relation Age of Onset  . Hypertension Mother   . Hypertension Father   . Stroke Father   . Cancer Cousin        brain tumor     ALLERGIES:  is allergic to no known allergies.  MEDICATIONS:  Current Outpatient Medications  Medication Sig Dispense Refill  . cholecalciferol (VITAMIN D) 1000 units tablet Take 1,000 Units by mouth daily.    . Multiple Vitamin (MULTIVITAMIN WITH MINERALS) TABS tablet Take 1 tablet by mouth daily.    . ondansetron (ZOFRAN-ODT) 4 MG disintegrating tablet Take 1 tablet (4 mg total) by mouth every 8 (eight) hours as needed for nausea or vomiting. 16 tablet 0  . Probiotic Product (PROBIOTIC ADVANCED PO) Take by mouth.    . tamoxifen (NOLVADEX) 20 MG tablet Take 1 tablet (20 mg total) by mouth daily. 30 tablet 11   Current Facility-Administered Medications  Medication Dose Route Frequency Provider Last Rate Last Dose  . Influenza vac split quadrivalent PF (FLUARIX) injection 0.5 mL  0.5 mL Intramuscular Once Truitt Merle, MD        REVIEW OF SYSTEMS:   Constitutional: Denies fevers,  chills or abnormal night sweats Eyes: Denies blurriness of vision, double vision or watery eyes Ears, nose, mouth, throat, and face: Denies mucositis or sore throat Respiratory: Denies cough, dyspnea or wheezes Cardiovascular: Denies palpitation, chest discomfort or lower extremity swelling Gastrointestinal:  Denies nausea, heartburn or change in bowel habits Skin: Denies abnormal skin rashes Lymphatics: Denies new lymphadenopathy or easy bruising Neurological:Denies numbness, tingling or new weaknesses Behavioral/Psych: Mood is stable, no new changes  Musculoskeletal: Negative All other systems were reviewed with the patient and are negative.  PHYSICAL EXAMINATION: ECOG PERFORMANCE STATUS: 0 - Asymptomatic  Vitals:   10/14/17 0840  BP: 115/87  Pulse: 73  Resp: 18  Temp: 97.9 F (36.6 C)  SpO2: 99%   Filed Weights   10/14/17 0840  Weight: 106 lb 12.8 oz (48.4 kg)     GENERAL:alert, no distress and comfortable SKIN: skin color, texture, turgor are normal, no rashes or significant lesions  EYES: normal, conjunctiva are pink and non-injected, sclera clear OROPHARYNX:no exudate, no erythema and lips, buccal mucosa, and tongue normal  NECK: supple, thyroid normal size, non-tender, without nodularity LYMPH:  no palpable lymphadenopathy in the cervical, axillary or inguinal LUNGS: clear to auscultation and percussion with normal breathing effort HEART: regular rate & rhythm and no murmurs and no lower extremity edema ABDOMEN:abdomen soft, non-tender and normal bowel sounds, no hepatomegaly  Musculoskeletal:no cyanosis of digits and no clubbing (+) tightness around right axillary tendon   PSYCH: alert & oriented x 3 with fluent speech NEURO: no focal motor/sensory deficits Breasts: Breast inspection showed Right breast is surgically absent, incision has well healed. Some mild skin pigmentation changes from radiation. Exam of left breast and bilateral axillas showed no palpable mass or  adenopathy with slight right axillary swelling   LABORATORY DATA:  I have reviewed the data as listed CBC Latest Ref Rng & Units 10/14/2017 04/12/2017 10/05/2016  WBC 3.9 - 10.3 K/uL 3.1(L) 3.4(L) 3.0(L)  Hemoglobin 11.6 - 15.9 g/dL 13.1 13.2 13.5  Hematocrit 34.8 - 46.6 % 38.2 37.6  38.8  Platelets 145 - 400 K/uL 123(L) 131(L) 116(L)   CMP Latest Ref Rng & Units 10/14/2017 04/12/2017 10/05/2016  Glucose 70 - 99 mg/dL 121(H) 82 98  BUN 6 - 20 mg/dL 14 14 11.3  Creatinine 0.44 - 1.00 mg/dL 0.76 0.64 0.7  Sodium 135 - 145 mmol/L 142 140 140  Potassium 3.5 - 5.1 mmol/L 3.9 4.0 3.9  Chloride 98 - 111 mmol/L 109 107 -  CO2 22 - 32 mmol/L 25 28 23   Calcium 8.9 - 10.3 mg/dL 8.4(L) 8.7 8.9  Total Protein 6.5 - 8.1 g/dL 6.4(L) 6.4 6.6  Total Bilirubin 0.3 - 1.2 mg/dL 0.5 0.4 0.37  Alkaline Phos 38 - 126 U/L 47 - 54  AST 15 - 41 U/L 22 20 26   ALT 0 - 44 U/L 20 18 21    PATHOLOGY REPORT  Diagnosis 11/02/2015 Breast, right, needle core biopsy, lateral - 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 - SEE COMMENT. Microscopic Comment There is a small focus of atypical ductal hyperplasia, supported by a positive stain for E-Cadherin and a negative stain for cytokeratin 5/6. The results were called to the [facility] on 11/03/2015  Diagnosis 10/05/2015 Breast, right, needle core biopsy INVASIVE DUCTAL CARCINOMA, GRADE 2 Results: IMMUNOHISTOCHEMICAL AND MORPHOMETRIC ANALYSIS PERFORMED MANUALLY Estrogen Receptor: 90%, POSITIVE, STRONG STAINING INTENSITY Progesterone Receptor: 95%, POSITIVE, STRONG STAINING INTENSITY Proliferation Marker Ki67: 10% Results: HER2 - NEGATIVE RATIO OF HER2/CEP17 SIGNALS 1.42 AVERAGE HER2 COPY NUMBER PER CELL 2.20  ONCOTYPE DX RS 19, predicts 10 year distance recurrence risk of 12% with tamoxifen   Diagnosis 11/21/2015 1. Breast, simple mastectomy, Right - INVASIVE DUCTAL CARCINOMA GRADE I/III,  SPANNING 1.6 CM. - DUCTAL CARICNOMA IN SITU, INTERMEDIATE GRADE. - LYMPHOVASCULAR INVASION IS IDENTIFIED. - PERINEURAL INVASION IS IDENTIFIED. - INVASIVE CARCINOMA IS FOCALLY 0.1 CM TO THE ANTERIOR MARGIN. - SEE ONCOLOGY TABLE BELOW. 2. Lymph node, sentinel, biopsy, Right Axillary - METASTATIC CARCINOMA IN 1 OF 1 LYMPH NODE (1/1). Microscopic Comment 1. BREAST, INVASIVE TUMOR, WITH LYMPH NODES PRESENT Specimen, including laterality and lymph node sampling (sentinel, non-sentinel): Right breast and right axillary lymph node. Procedure: Simple mastectomy and one lymph node resection. Histologic type: Ductal Grade: I Tubule formation: 2 Nuclear pleomorphism: 2 Mitotic:1 Tumor size (gross measurement): 1.6 cm Margins: Invasive, distance to closest margin: Focally 0.1 cm to the anterior margin. Lymphovascular invasion: Present. Ductal carcinoma in situ: Present. Grade: Intermediate. Extensive intraductal component: Not identified. Lobular neoplasia: Not identified. Tumor focality: Unifocal Extent of tumor: Confined to breast parenchyma. Lymph nodes: 1 of 3 FINAL for REVECA, DESMARAIS ([id number]) Microscopic Comment(continued) Examined: 1 Sentinel 0 Non-sentinel 1 Total Lymph nodes with metastasis: 1 Isolated tumor cells (< 0.2 mm): 0 Micrometastasis: (> 0.2 mm and < 2.0 mm): 0 Macrometastasis: (> 2.0 mm): 1 Extracapsular extension: Not identified. Breast prognostic profile: Case [id number] Estrogen receptor: 90%, strong Progesterone receptor: 95%, strong. Her 2 neu: No amplification was detected. The ratio was 1.42. Ki-67: 10% Non-neoplastic breast: Fibrocystic changes, pseudoangiomatous stromal hyperplasia (PASH), and healing biopsy sites. TNM: pT1c, pN1a Comments: There are two lesions containing biopsy clips. The 1.6 cm lesion in the mid/inferior lateral specimen reveals grade I invasive ductal carcinoma. This tumor is focally 0.1 cm to the anterior margin. In addition,  there is a 0.8 cm vaguely nodular lesion present lateral to this larger mass, containing a dumbbell shaped clip. Histologic evaluation of this area reveals benign fibrocystic changes and pseudoangiomatous stromal hyperplasia with a healing biopsy  site. No carcinoma is identified here. (JBK:gt, 11/22/15)  RADIOGRAPHIC STUDIES: I have personally reviewed the radiological images as listed and agreed with the findings in the report.  Diagnostic Mammogram Left Breast 10/04/16 at [facility] There is no mammographic evidence of malignancy. Routine mammographic evaluation in 1 year is recommended.  This exam was interpreted at the [facility] location.  MRI Breast b/l 10/13/2015 IMPRESSION: Irregular enhancing mass within the lower outer right breast compatible with recently biopsied right breast carcinoma. There a few small spicules of this mass extending posteriorly toward the pectoralis muscle. No definitive pectoralis muscle enhancement to suggest invasion identified on current examination. Within the lateral aspect of the right breast there is a 3.6 cm area of suspicious non mass enhancement.  ASSESSMENT & PLAN:  52 y.o. premenopausal [place] woman, presented with a palpable right breast mass for one year.  1. Breast cancer of the lower inner quadrant of right female breast, pT1cN1aM0, stage IIA, ER/PR strongly positive, HER-2 negative, grade 2, Oncotype RS 19 -We previously discussed her surgical pathology findings with patient in details. -She had a complete surgical resection in 11/2015 with negative margins, unfortunately the 1 sentinel lymph nodes was positive.  -the Oncotype Dx was done on her initial biopsy before surgery, the result was reviewed with her in details. She has intermediate risk based on the recurrence score, which predicts 10 year distant recurrence after 5 years of tamoxifen 12%. The benefit of chemotherapy in the intermediate risk group is small and controversial.  Given her relatively low score in the intermediate group, I did not recommend adjuvant chemotherapy. She agrees with the plan -Her surgical path showed low grade tumor, low KI67, I do not think she needs mammaprint, which will likely be low risk disease if we did  -She started tamoxifen before surgery in 09/2015, has been tolerating well, without significant side effects. We'll continue for 10 years. The alternative option of switching her to aromatase inhibitor after she became postmenopause was also discussed with her previously and today again.  She is interested. Her last menstrual period was in 11/2016, will check her [place] level when she returns in 6 months  -She is clinically doing very well, exam was unremarkable, lab reviewed, CBC showed a mild leukopenia and thrombocytopenia, overall stable, no other concerns.  No clinical concern for recurrence. -Continue breast cancer surveillance.  Her last mammogram at the [facility] was a few weeks ago, will get a copy -Continue tamoxifen, follow-up in 6 months.  2. Mild leukopenia and thrombocytopenia  -started after tamoxifen, intermittent, likely secondary to tamoxifen  -overall stable and mild, we'll continue monitoring.    Plan -Continue tamoxifen, I refilled for her -Lab and follow-up in 6 months, will do lab before her visit including [place] and estradiol level (unless she had menstrual period again)  All questions were answered. The patient knows to call the clinic with any problems, questions or concerns.  I spent 15 minutes counseling the patient face to face. The total time spent in the appointment was 20 minutes and more than 50% was on counseling.  Oneal Deputy, am acting as scribe for Truitt Merle, MD.   I have reviewed the above documentation for accuracy and completeness, and I agree with the above.     Truitt Merle  10/14/2017 9:32 AM

## 2017-10-14 ENCOUNTER — Inpatient Hospital Stay: Payer: 59

## 2017-10-14 ENCOUNTER — Encounter: Payer: Self-pay | Admitting: Hematology

## 2017-10-14 ENCOUNTER — Telehealth: Payer: Self-pay | Admitting: Hematology

## 2017-10-14 ENCOUNTER — Inpatient Hospital Stay: Payer: 59 | Attending: Hematology | Admitting: Hematology

## 2017-10-14 VITALS — BP 115/87 | HR 73 | Temp 97.9°F | Resp 18 | Ht 64.0 in | Wt 106.8 lb

## 2017-10-14 DIAGNOSIS — Z170 Estrogen receptor positive status [ER+]: Secondary | ICD-10-CM

## 2017-10-14 DIAGNOSIS — Z923 Personal history of irradiation: Secondary | ICD-10-CM

## 2017-10-14 DIAGNOSIS — C50311 Malignant neoplasm of lower-inner quadrant of right female breast: Secondary | ICD-10-CM

## 2017-10-14 DIAGNOSIS — D696 Thrombocytopenia, unspecified: Secondary | ICD-10-CM | POA: Diagnosis not present

## 2017-10-14 DIAGNOSIS — Z23 Encounter for immunization: Secondary | ICD-10-CM

## 2017-10-14 DIAGNOSIS — D72819 Decreased white blood cell count, unspecified: Secondary | ICD-10-CM | POA: Insufficient documentation

## 2017-10-14 DIAGNOSIS — Z9011 Acquired absence of right breast and nipple: Secondary | ICD-10-CM

## 2017-10-14 DIAGNOSIS — Z79810 Long term (current) use of selective estrogen receptor modulators (SERMs): Secondary | ICD-10-CM

## 2017-10-14 DIAGNOSIS — Z1321 Encounter for screening for nutritional disorder: Secondary | ICD-10-CM

## 2017-10-14 DIAGNOSIS — Z853 Personal history of malignant neoplasm of breast: Secondary | ICD-10-CM

## 2017-10-14 LAB — COMPREHENSIVE METABOLIC PANEL
ALBUMIN: 3.6 g/dL (ref 3.5–5.0)
ALT: 20 U/L (ref 0–44)
ANION GAP: 8 (ref 5–15)
AST: 22 U/L (ref 15–41)
Alkaline Phosphatase: 47 U/L (ref 38–126)
BILIRUBIN TOTAL: 0.5 mg/dL (ref 0.3–1.2)
BUN: 14 mg/dL (ref 6–20)
CO2: 25 mmol/L (ref 22–32)
Calcium: 8.4 mg/dL — ABNORMAL LOW (ref 8.9–10.3)
Chloride: 109 mmol/L (ref 98–111)
Creatinine, Ser: 0.76 mg/dL (ref 0.44–1.00)
GFR calc Af Amer: >60 mL/min (ref >60)
GFR calc non Af Amer: >60 mL/min (ref >60)
GLUCOSE: 121 mg/dL — AB (ref 70–99)
POTASSIUM: 3.9 mmol/L (ref 3.5–5.1)
SODIUM: 142 mmol/L (ref 135–145)
Total Protein: 6.4 g/dL — ABNORMAL LOW (ref 6.5–8.1)

## 2017-10-14 LAB — CBC WITH DIFFERENTIAL/PLATELET
Basophils Absolute: 0 10*3/uL (ref 0.0–0.1)
Basophils Relative: 0 %
Eosinophils Absolute: 0 10*3/uL (ref 0.0–0.5)
Eosinophils Relative: 1 %
HEMATOCRIT: 38.2 % (ref 34.8–46.6)
Hemoglobin: 13.1 g/dL (ref 11.6–15.9)
LYMPHS ABS: 1 10*3/uL (ref 0.9–3.3)
LYMPHS PCT: 32 %
MCH: 33.5 pg (ref 25.1–34.0)
MCHC: 34.4 g/dL (ref 31.5–36.0)
MCV: 97.6 fL (ref 79.5–101.0)
MONOS PCT: 7 %
Monocytes Absolute: 0.2 10*3/uL (ref 0.1–0.9)
NEUTROS ABS: 1.9 10*3/uL (ref 1.5–6.5)
NEUTROS PCT: 60 %
Platelets: 123 10*3/uL — ABNORMAL LOW (ref 145–400)
RBC: 3.92 MIL/uL (ref 3.70–5.45)
RDW: 12.2 % (ref 11.2–14.5)
WBC: 3.1 10*3/uL — ABNORMAL LOW (ref 3.9–10.3)

## 2017-10-14 MED ORDER — INFLUENZA VAC SPLIT QUAD 0.5 ML IM SUSY: 0.5000 mL | PREFILLED_SYRINGE | Freq: Once | INTRAMUSCULAR | Status: DC

## 2017-10-14 MED ORDER — TAMOXIFEN CITRATE 20 MG PO TABS: 20.0000 mg | ORAL_TABLET | Freq: Every day | ORAL | 11 refills | Status: DC

## 2017-10-14 MED ORDER — INFLUENZA VAC SPLIT QUAD 0.5 ML IM SUSY
0.5000 mL | PREFILLED_SYRINGE | Freq: Once | INTRAMUSCULAR | Status: AC
  Administered 2017-10-14: 0.5 mL via INTRAMUSCULAR

## 2017-10-14 NOTE — Addendum Note (Signed)
Addended by: Kasandra Knudsen A on: 10/14/2017 09:37 AM   Modules accepted: Orders

## 2017-10-14 NOTE — Telephone Encounter (Signed)
Gave pt avs and calendar  °

## 2017-10-14 NOTE — Patient Instructions (Signed)

## 2017-10-14 NOTE — Addendum Note (Signed)
Addended by: Kasandra Knudsen A on: 10/14/2017 09:21 AM   Modules accepted: Orders

## 2017-10-15 LAB — VITAMIN D 25 HYDROXY (VIT D DEFICIENCY, FRACTURES): Vit D, 25-Hydroxy: 38.4 ng/mL (ref 30.0–100.0)

## 2017-10-16 ENCOUNTER — Telehealth: Payer: Self-pay

## 2017-10-16 NOTE — Telephone Encounter (Signed)
Left voice message for patient per Dr. Burr Medico notified Vitamin D wnl, no concerns.

## 2017-10-16 NOTE — Telephone Encounter (Signed)
-----   Message from Truitt Merle, MD sent at 10/16/2017  7:04 AM EDT ----- Please let her know the VitD level WNL, no concerns, thanks  Truitt Merle  10/16/2017

## 2017-12-05 IMAGING — DX DG CHEST 2V
2 series · 2 of 2 positions shown · non-contrast
Comparison: None.

CLINICAL DATA: Cough for 2 weeks. History breast cancer post
radiation.

EXAM:
CHEST  2 VIEW

[chest pa]
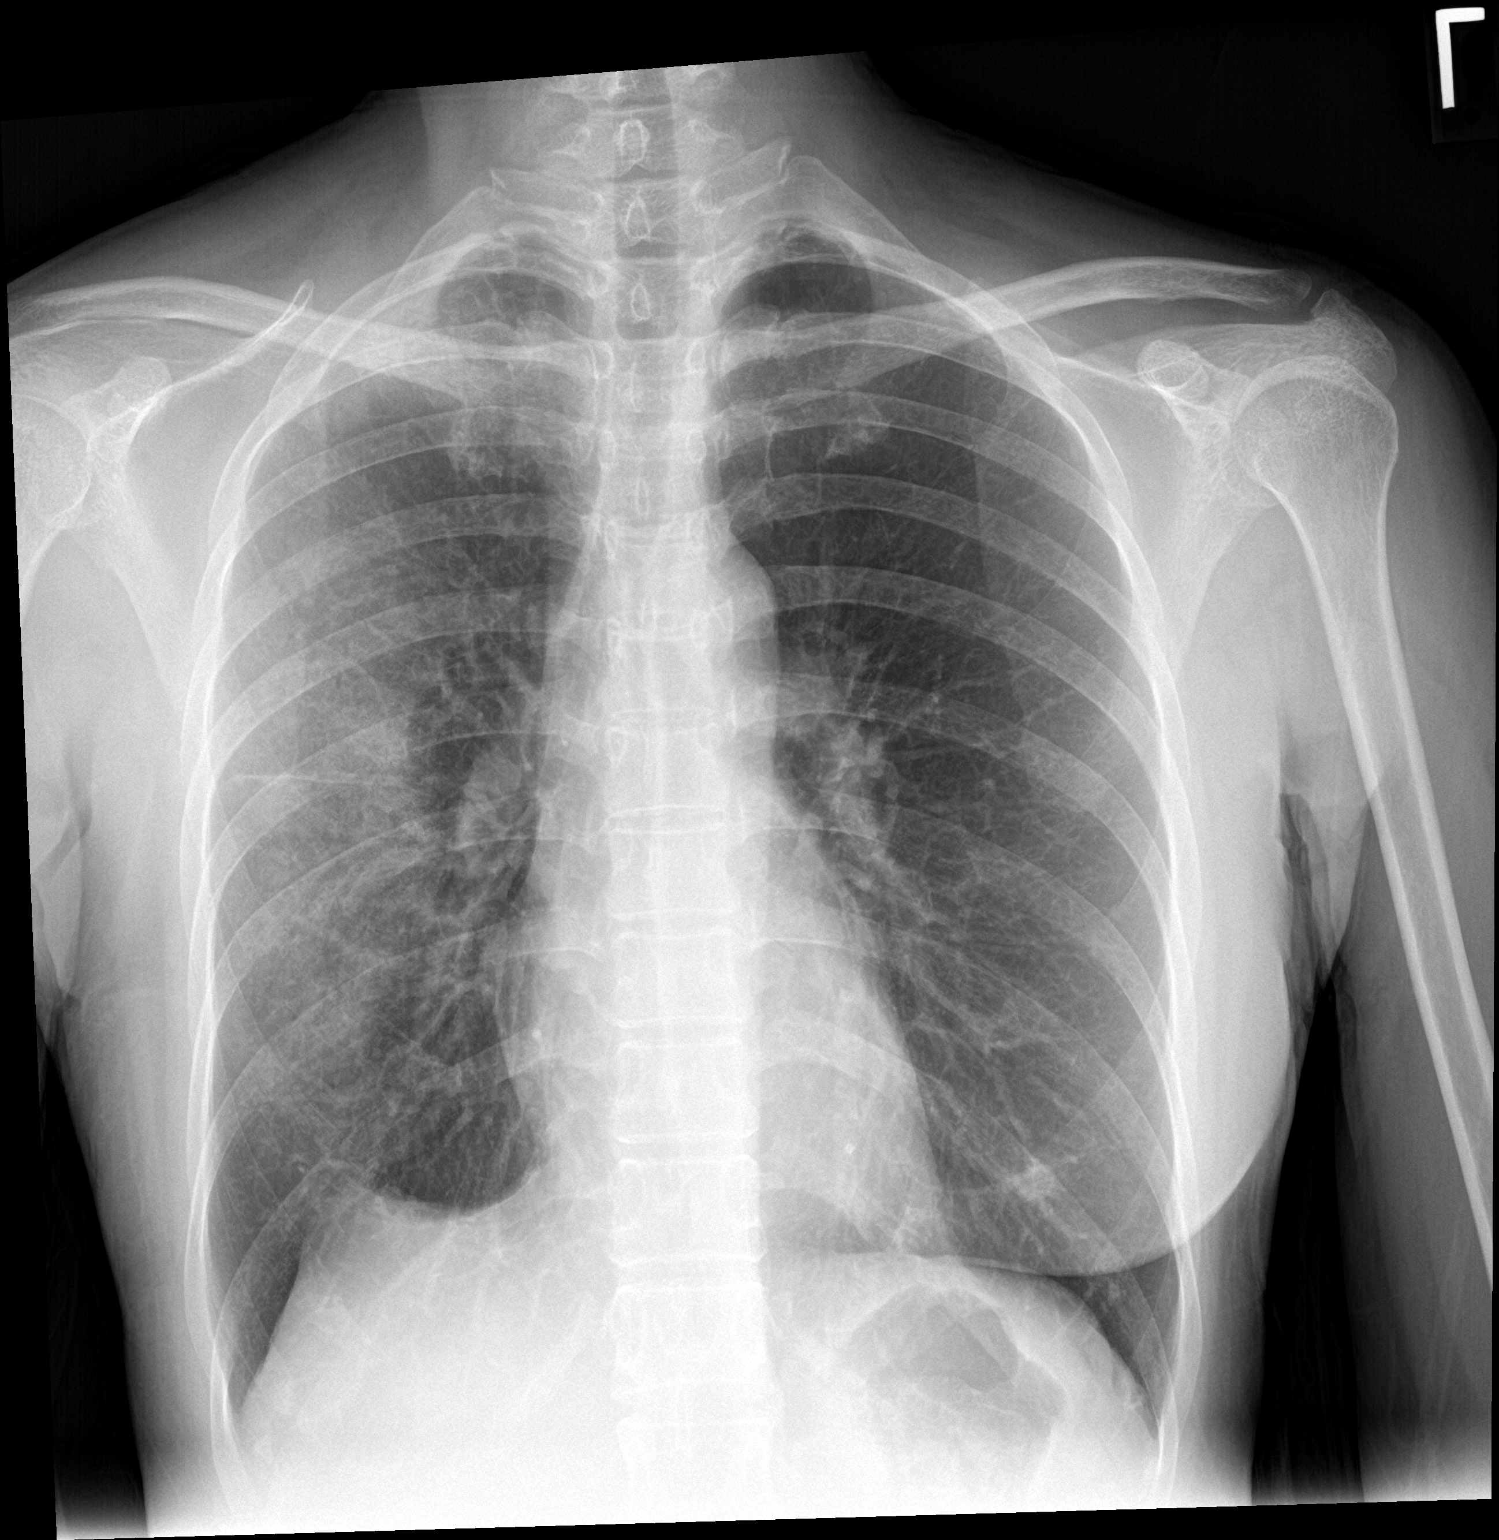

[chest lat]
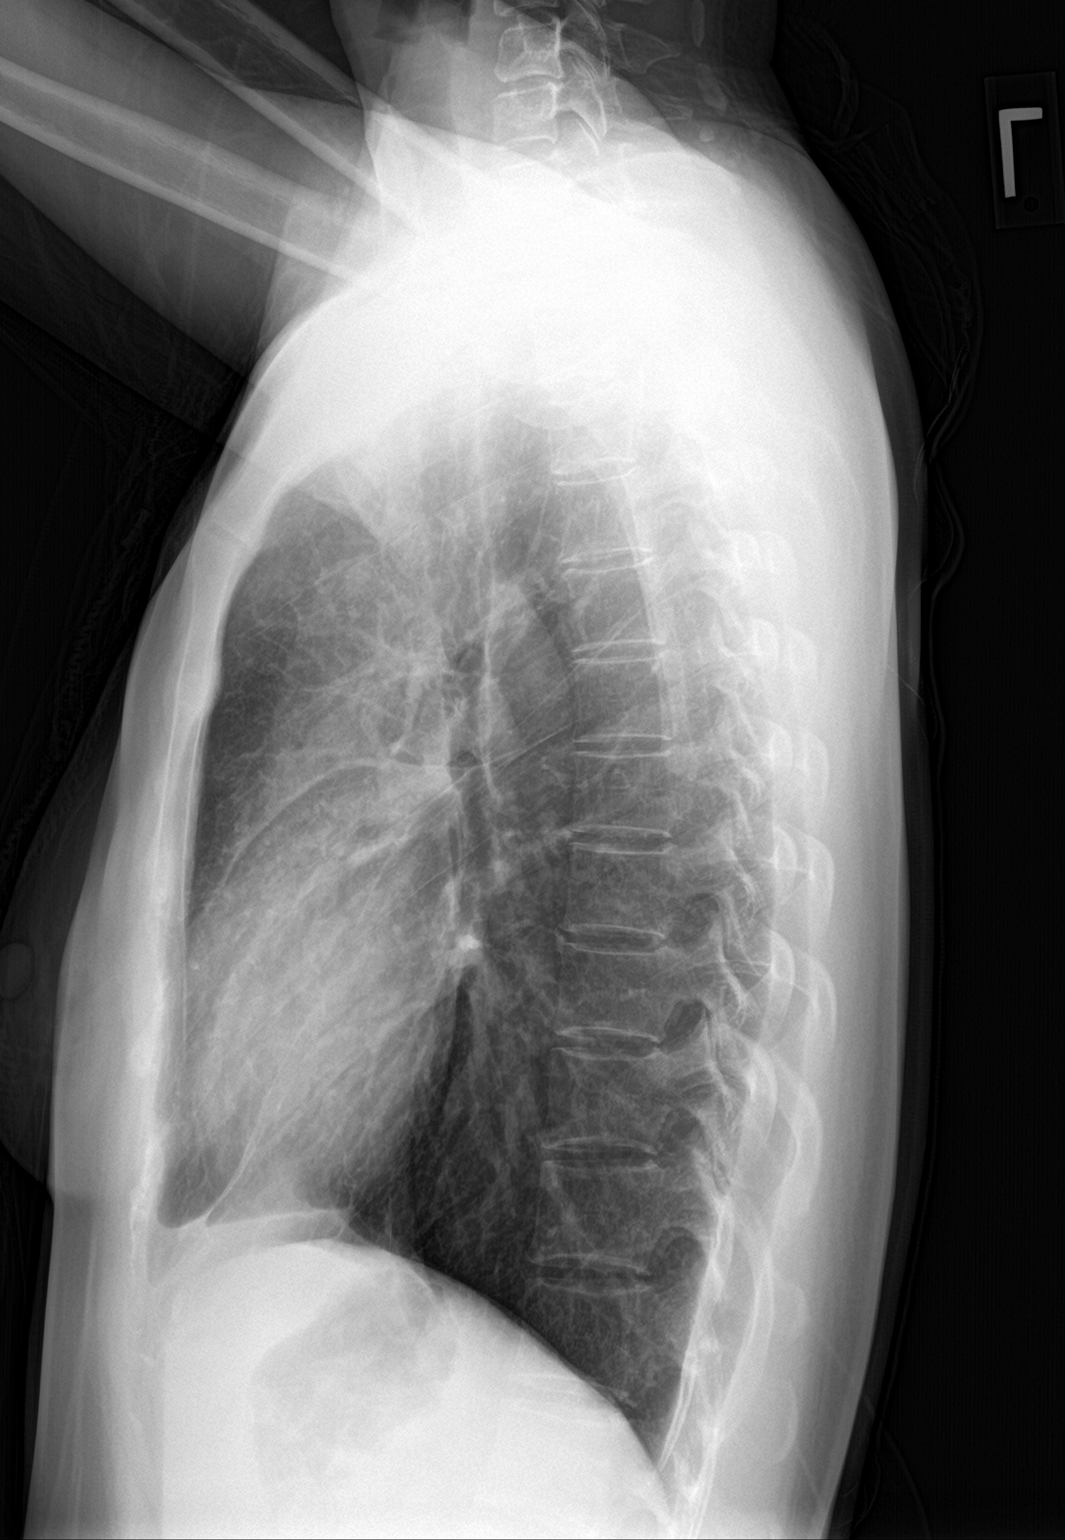

[2 of 2 positions shown; findings below may reference images not displayed]

FINDINGS: Ill-defined linear oriented ground-glass opacity in the right lung
extends from the diaphragm to the level of the aortic arch. Minimal
diaphragmatic tenting. No confluent airspace disease. Minimal
fissural thickening of the minor fissure on the right. No frank
pleural effusion. No dominant pulmonary mass. Normal heart size and
mediastinal contours. No pleural fluid or pneumothorax. No focal
osseous abnormality is seen. Post right mastectomy.
IMPRESSION: 1. Findings in the right chest suggest postradiation change.
2. No evidence of pneumonia.

## 2018-04-21 ENCOUNTER — Ambulatory Visit: Payer: 59 | Admitting: Hematology

## 2018-05-26 ENCOUNTER — Telehealth: Payer: Self-pay | Admitting: Hematology

## 2018-05-26 NOTE — Telephone Encounter (Signed)
Called patient per sch msg to reschedule 5/14 appt. Patient wants to schedule for early July. Confirmed date and time

## 2018-05-29 ENCOUNTER — Ambulatory Visit: Payer: 59 | Admitting: Hematology

## 2018-07-16 NOTE — Progress Notes (Signed)
[place]   [phone] Fax:([phone]   Clinic Follow up Note   Patient Care Team: Trixie Dredge, PA-C as PCP - General (Physician Assistant) Kyung Rudd, MD as Consulting Physician (Radiation Oncology) Truitt Merle, MD as Consulting Physician (Hematology) Erroll Luna, MD as Consulting Physician (General Surgery) Gardenia Phlegm, NP as Nurse Practitioner (Hematology and Oncology)  Date of Service:  07/23/2018  CHIEF COMPLAINT: Follow up right breast cancer  SUMMARY OF ONCOLOGIC HISTORY: Oncology History Overview Note  Breast cancer of lower-inner quadrant of right female breast [facility])   Staging form: Breast, AJCC 7th Edition   - Clinical stage from 10/04/2015: Stage IA (T1c, N0, M0) - Signed by Truitt Merle, MD on 10/12/2015   - Pathologic stage from 11/21/2015: Stage IIA (T1c, N1a, cM0) - Signed by Truitt Merle, MD on 12/12/2015    History of right breast cancer  10/04/2015 Mammogram   Diagnostic mammogram and US showed a 1.6cm lobulated irregular high density mass in the lower inner quadrant right breast, possible chest wall invasion and skin involvement, right axillar [place] (-)   10/05/2015 Initial Diagnosis   Breast cancer of lower-inner quadrant of right female breast ([place])   10/05/2015 Initial Biopsy   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    10/05/2015 Receptors her2   ER 90% +, PR 95%+, strong staining, Her2-, Ki67 10%   10/05/2015 Oncotype testing   Oncotype recurrence score of 19, intermediate risk, predicts 10 year distant recurrence risk of 12% with tamoxifen.   10/13/2015 Imaging   Bilateral breast MRI with and without contrast showed the known 1.7 x 1.7 x 1.0 cm enhancing mass, and a 2.0 x 1.4 x 3.6 cm area of non-mass enhancement. Left breast was negative. Lymph nodes appear normal.   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 Donne Hazel    11/21/2015 Pathology Results   Right breast mastectomy showed 1.6cm invasive ductal carcinoma, G1, (+) LVI, (+) perineural invasion, one SLN was positive, margins were negative    12/27/2015 - 02/08/2016 Radiation Therapy   Adjuvant radiation to right Chest Wall, Axilla, SCV treated to 50 Gy in 25 fractions, and IM nodes to >45 Gy in 25 fractions, and then chest wall scar boosted an additional 10 Gy in 5 fractions.      CURRENT THERAPY:  Tamoxifen 20 mg once daily, started on 10/13/2015  INTERVAL HISTORY:  Shelly Smith is here for a follow up right breast cancer. She presents to the clinic alone. She notes she is doing well. She has been working from home but will go into office every 2 weeks. She notes her BP was 119/93. She notes this has not been an issue before. She denies HA or chest discomfort. She denies having any recent infection. She does not in the winter she will have coughs but never gets seen by physician for this. She notes she will often find random scratches of her right neck.  She notes she is tolerating Tamoxifen well, no new pain, joint stiffness.     REVIEW OF SYSTEMS:   Constitutional: Denies fevers, chills or abnormal weight loss Eyes: Denies blurriness of vision Ears, nose, mouth, throat, and face: Denies mucositis or sore throat Respiratory:  Denies cough, dyspnea or wheezes Cardiovascular: Denies palpitation, chest discomfort or lower extremity swelling Gastrointestinal:  Denies nausea, heartburn or change in bowel habits Skin: Denies abnormal skin rashes (+) scratches of right neck  Lymphatics: Denies new lymphadenopathy or easy bruising Neurological:Denies numbness, tingling or new weaknesses Behavioral/Psych: Mood is stable, no new changes  All other systems were reviewed with the  patient and are negative.  MEDICAL HISTORY:  Past Medical History:  Diagnosis Date  . 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 History of bronchitis   . History of radiation therapy 12/27/15- 02/08/16   Right Chest Wall, Axilla, SCV treated to 50 Gy in 25 fractions, and IM nodes to >45 Gy in 25 fractions, and then chest wall scar boosted an additional 10 Gy in 5 fractions.    SURGICAL HISTORY: Past Surgical History:  Procedure Laterality Date  . MASTECTOMY W/ SENTINEL NODE BIOPSY Right 11/21/2015   Procedure: RIGHT TOTAL MASTECTOMY WITH RIGHT AXILLARY SENTINEL LYMPH NODE BIOPSY;  Surgeon: Rolm Bookbinder, MD;  Location: [place];  Service: General;  Laterality: Right;    I have reviewed the social history and family history with the patient and they are unchanged from previous note.  ALLERGIES:  is allergic to no known allergies.  MEDICATIONS:  Current Outpatient Medications  Medication Sig Dispense Refill  . cholecalciferol (VITAMIN D) 1000 units tablet Take 1,000 Units by mouth daily.    . Multiple Vitamin (MULTIVITAMIN WITH MINERALS) TABS tablet Take 1 tablet by mouth daily.    . Probiotic Product (PROBIOTIC ADVANCED PO) Take by mouth.    . tamoxifen (NOLVADEX) 20 MG tablet Take 1 tablet (20 mg total) by mouth daily. 30 tablet 11   No current facility-administered medications for this visit.     PHYSICAL EXAMINATION: ECOG PERFORMANCE STATUS: 0 - Asymptomatic  Vitals:   07/23/18 0939  BP: (!) 119/93  Pulse: 81  Resp: 16  Temp: 98.2 F (36.8 C)  SpO2: 99%   Filed Weights   07/23/18 0939  Weight: 104 lb 4.8 oz (47.3 kg)    GENERAL:alert, no distress and comfortable SKIN: skin color, texture, turgor are normal, no rashes or significant lesions EYES: normal, Conjunctiva are pink and non-injected, sclera clear  NECK: supple, thyroid normal size, non-tender, without nodularity LYMPH:  no palpable lymphadenopathy in the cervical,  axillary  LUNGS: clear to auscultation and percussion with normal breathing effort HEART: regular rate & rhythm and no murmurs and no lower extremity edema ABDOMEN:abdomen soft, non-tender and normal bowel sounds Musculoskeletal:no cyanosis of digits and no clubbing  NEURO: alert & oriented x 3 with fluent speech, no focal motor/sensory deficits BREAST: S/p right mastectomy, right breast surgically absent: surgical incision healed well. (+) No palpable mass or adenopathy b/l   LABORATORY DATA:  I have reviewed the data as listed CBC Latest Ref Rng & Units 07/23/2018 10/14/2017 04/12/2017  WBC 4.0 - 10.5 K/uL 3.5(L) 3.1(L) 3.4(L)  Hemoglobin 12.0 - 15.0 g/dL 14.1 13.1 13.2  Hematocrit 36.0 - 46.0 % 40.2 38.2 37.6  Platelets 150 - 400 K/uL 131(L) 123(L) 131(L)     CMP Latest Ref Rng & Units 07/23/2018 10/14/2017 04/12/2017  Glucose 70 - 99 mg/dL 141(H) 121(H) 82  BUN 6 - 20 mg/dL _0 Creatinine 0.44 - 1.00 mg/dL 0.79 0.76 0.64  Sodium 135 - 145 mmol/L 141 142 140  Potassium 3.5 - 5.1 mmol/L 3.7 3.9 4.0  Chloride 98 - 111 mmol/L 106 109  107  CO2 22 - 32 mmol/L _0 Calcium 8.9 - 10.3 mg/dL 8.6(L) 8.4(L) 8.7  Total Protein 6.5 - 8.1 g/dL 7.1 6.4(L) 6.4  Total Bilirubin 0.3 - 1.2 mg/dL 0.4 0.5 0.4  Alkaline Phos 38 - 126 U/L 53 47 -  AST 15 - 41 U/L _1 ALT 0 - 44 U/L _2 RADIOGRAPHIC STUDIES: I have personally reviewed the radiological images as listed and agreed with the findings in the report. No results found.   ASSESSMENT & PLAN:  Shelly Smith is a 53 y.o. female with   1. 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 She is s/p right mastectomy and adjuvant radiation due to 1 positive sentinel lymph nodes was positive.  -the Oncotype Dx was done on her initial biopsy before surgery, the result was reviewed with her in  details. She has intermediate risk based on the recurrence score, which predicts 10 year distant recurrence after 5 years of tamoxifen 12%. The benefit of chemotherapy in the intermediate risk group is small and controversial. Given her relatively low score in the intermediate group, I did not recommend adjuvant chemotherapy.  -She started Tamoxifen prior to surgery in 09/2015. She has been tolerating well, without significant side effects. We'll continue for 10 years. The alternative option of switching her to aromatase inhibitor for 5 years after she became postmenopause was also discussed with her again today along with their side effects such as joint pain, can weaken her bone. Her last menstrual period was in 11/2016. She is apprehensive about switching given bone density.  -She is clinically doing well. Lab reviewed, her CBC and CMP are within normal limits except WBC 3.5, PLT 113K, BG 141, Ca 8.6. Her physical exam and her 09/2017 mammogram were unremarkable. There is no clinical concern for recurrence. -I encouraged her to take oral Calcium daily. She can continue VitD 1000 units daily.  -Continue breast surveillance. Next mammogram in 09/2018  -Continue Tamoxifen, may change to AI.  -I encouraged her to continue to follow up with a PCP for annual physical given Tamoxifen can slow down her metabolism, increase her blood sugar, cholesterol and BP.  -F/u in 6 months.    2. Mild leukopenia and thrombocytopenia  -Started after tamoxifen, intermittent, likely secondary to tamoxifen  -WBC 3.5, PLT 131K today (07/23/18) overall stable and mild, we'll continue monitoring.    3. Bone health  -She is considering switching tamoxifen to AI. -Will obtain bone density scan once her lab shows she is post-menopausal. She is agreeable.    Plan -She is clinically doing well and stable.  -will call her with [facility] result, if she is postmenopausal, will get a DEXA scan in Sep  -Continue tamoxifen -Mammogram  in 09/2018 at [facility] and follow-up in 6 months   No problem-specific Assessment & Plan notes found for this encounter.   Orders Placed This Encounter  Procedures  . MM DIAG BREAST TOMO UNI LEFT    Standing Status:   Future    Standing Expiration Date:   07/23/2019    Scheduling Instructions:     SOLIS    Order Specific Question:   Reason for Exam (SYMPTOM  OR DIAGNOSIS REQUIRED)    Answer:   screening    Order Specific Question:   Is the patient  pregnant?    Answer:   No    Order Specific Question:   Preferred imaging location?    Answer:   External   All questions were answered. The patient knows to call the clinic with any problems, questions or concerns. No barriers to learning was detected. I spent 15 minutes counseling the patient face to face. The total time spent in the appointment was 20 minutes and more than 50% was on counseling and review of test results     Truitt Merle, MD 07/23/2018   I, Joslyn Devon, am acting as scribe for Truitt Merle, MD.   I have reviewed the above documentation for accuracy and completeness, and I agree with the above.

## 2018-07-23 ENCOUNTER — Inpatient Hospital Stay: Payer: 59

## 2018-07-23 ENCOUNTER — Telehealth: Payer: Self-pay | Admitting: Hematology

## 2018-07-23 ENCOUNTER — Inpatient Hospital Stay: Payer: 59 | Attending: Hematology | Admitting: Hematology

## 2018-07-23 ENCOUNTER — Encounter: Payer: Self-pay | Admitting: Hematology

## 2018-07-23 ENCOUNTER — Other Ambulatory Visit: Payer: Self-pay

## 2018-07-23 VITALS — BP 119/93 | HR 81 | Temp 98.2°F | Resp 16 | Wt 104.3 lb

## 2018-07-23 DIAGNOSIS — D72819 Decreased white blood cell count, unspecified: Secondary | ICD-10-CM | POA: Insufficient documentation

## 2018-07-23 DIAGNOSIS — Z853 Personal history of malignant neoplasm of breast: Secondary | ICD-10-CM

## 2018-07-23 DIAGNOSIS — C50311 Malignant neoplasm of lower-inner quadrant of right female breast: Secondary | ICD-10-CM | POA: Diagnosis not present

## 2018-07-23 DIAGNOSIS — Z170 Estrogen receptor positive status [ER+]: Secondary | ICD-10-CM | POA: Insufficient documentation

## 2018-07-23 DIAGNOSIS — Z923 Personal history of irradiation: Secondary | ICD-10-CM | POA: Insufficient documentation

## 2018-07-23 DIAGNOSIS — D696 Thrombocytopenia, unspecified: Secondary | ICD-10-CM | POA: Insufficient documentation

## 2018-07-23 DIAGNOSIS — Z79810 Long term (current) use of selective estrogen receptor modulators (SERMs): Secondary | ICD-10-CM | POA: Insufficient documentation

## 2018-07-23 LAB — COMPREHENSIVE METABOLIC PANEL
ALT: 20 U/L (ref 0–44)
AST: 23 U/L (ref 15–41)
Albumin: 4 g/dL (ref 3.5–5.0)
Alkaline Phosphatase: 53 U/L (ref 38–126)
Anion gap: 11 (ref 5–15)
BUN: 13 mg/dL (ref 6–20)
CO2: 24 mmol/L (ref 22–32)
Calcium: 8.6 mg/dL — ABNORMAL LOW (ref 8.9–10.3)
Chloride: 106 mmol/L (ref 98–111)
Creatinine, Ser: 0.79 mg/dL (ref 0.44–1.00)
GFR calc Af Amer: >60 mL/min (ref >60)
GFR calc non Af Amer: >60 mL/min (ref >60)
Glucose, Bld: 141 mg/dL — ABNORMAL HIGH (ref 70–99)
Potassium: 3.7 mmol/L (ref 3.5–5.1)
Sodium: 141 mmol/L (ref 135–145)
Total Bilirubin: 0.4 mg/dL (ref 0.3–1.2)
Total Protein: 7.1 g/dL (ref 6.5–8.1)

## 2018-07-23 LAB — CBC WITH DIFFERENTIAL/PLATELET
Abs Immature Granulocytes: 0 10*3/uL (ref 0.00–0.07)
Basophils Absolute: 0 10*3/uL (ref 0.0–0.1)
Basophils Relative: 0 %
Eosinophils Absolute: 0 10*3/uL (ref 0.0–0.5)
Eosinophils Relative: 1 %
HCT: 40.2 % (ref 36.0–46.0)
Hemoglobin: 14.1 g/dL (ref 12.0–15.0)
Immature Granulocytes: 0 %
Lymphocytes Relative: 33 %
Lymphs Abs: 1.2 10*3/uL (ref 0.7–4.0)
MCH: 32.9 pg (ref 26.0–34.0)
MCHC: 35.1 g/dL (ref 30.0–36.0)
MCV: 93.7 fL (ref 80.0–100.0)
Monocytes Absolute: 0.2 10*3/uL (ref 0.1–1.0)
Monocytes Relative: 6 %
Neutro Abs: 2.1 10*3/uL (ref 1.7–7.7)
Neutrophils Relative %: 60 %
Platelets: 131 10*3/uL — ABNORMAL LOW (ref 150–400)
RBC: 4.29 MIL/uL (ref 3.87–5.11)
RDW: 11.6 % (ref 11.5–15.5)
WBC: 3.5 10*3/uL — ABNORMAL LOW (ref 4.0–10.5)
nRBC: 0 % (ref 0.0–0.2)

## 2018-07-23 NOTE — Telephone Encounter (Signed)
Gave avs and calendar ° °

## 2018-07-24 LAB — ESTRADIOL: Estradiol: 725.2 pg/mL

## 2018-07-24 LAB — FOLLICLE STIMULATING HORMONE: FSH: 18.2 m[IU]/mL

## 2018-07-28 ENCOUNTER — Telehealth: Payer: Self-pay | Admitting: *Deleted

## 2018-07-28 ENCOUNTER — Telehealth: Payer: Self-pay

## 2018-07-28 NOTE — Telephone Encounter (Signed)
Left message for this patient informing her that her lab results show she is premenopausal and is at risk for pregnancy.  She needs to come in for a pregnancy test either here at [facility] or PCP office and use precautions to keep from getting pregnant while on Tamoxifen.  I asked that she call me back and let me know if she would like to come here for the pregnancy test and if so a day morning or afternoon appointment.

## 2018-07-28 NOTE — Telephone Encounter (Signed)
TCT patient regarding lab tests from last week. Per Dr. Burr Medico, pt is pre-menopausal and her estrdiol level is high. Dr. Burr Medico wants to r/o pregnancy and is recommending a pregnancy test for patient.  Also, Dr. Burr Medico wants pt to know to avoid becoming prenant while on Tamoxifen. Pt could have pregnancy test done here or at her PCP office. Advised pt to call back @ [phone].

## 2018-10-07 ENCOUNTER — Other Ambulatory Visit: Payer: Self-pay | Admitting: Hematology

## 2018-10-07 DIAGNOSIS — C50311 Malignant neoplasm of lower-inner quadrant of right female breast: Secondary | ICD-10-CM

## 2019-01-22 NOTE — Progress Notes (Signed)
Shelly Smith   Telephone:(336) [phone] Fax:(336) [phone]   Clinic Follow up Note   Patient Care Team: Kris Mouton as PCP - General (Physician Assistant) Kyung Rudd, MD as Consulting Physician (Radiation Oncology) Truitt Merle, MD as Consulting Physician (Hematology) Erroll Luna, MD as Consulting Physician (General Surgery) Delice Bison Charlestine Massed, NP as Nurse Practitioner (Hematology and Oncology)  I connected with Shelly Smith on 01/23/2019 at  2:40 PM EST by video enabled telemedicine visit and verified that I am speaking with the correct person using two identifiers.  I discussed the limitations, risks, security and privacy concerns of performing an evaluation and management service by telephone and the availability of in person appointments. I also discussed with the patient that there may be a patient responsible charge related to this service. The patient expressed understanding and agreed to proceed.   Patient's location:  Her home  Provider's location:  My Office   CHIEF COMPLAINT: Follow up right breast cancer  SUMMARY OF ONCOLOGIC HISTORY: Oncology History Overview Note  Breast cancer of lower-inner quadrant of right female breast [facility])   Staging form: Breast, AJCC 7th Edition   - Clinical stage from 10/04/2015: Stage IA (T1c, N0, M0) - Signed by Truitt Merle, MD on 10/12/2015   - Pathologic stage from 11/21/2015: Stage IIA (T1c, N1a, cM0) - Signed by Truitt Merle, MD on 12/12/2015    History of right breast cancer  10/04/2015 Mammogram   Diagnostic mammogram and US showed a 1.6cm lobulated irregular high density mass in the lower inner quadrant right breast, possible chest wall invasion and skin involvement, right axillar [place] (-)   10/05/2015 Initial Diagnosis   Breast cancer of lower-inner quadrant of right female breast ([place])   10/05/2015 Initial Biopsy   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    10/05/2015 Receptors  her2   ER 90% +, PR 95%+, strong staining, Her2-, Ki67 10%   10/05/2015 Oncotype testing   Oncotype recurrence score of 19, intermediate risk, predicts 10 year distant recurrence risk of 12% with tamoxifen.   10/13/2015 Imaging   Bilateral breast MRI with and without contrast showed the known 1.7 x 1.7 x 1.0 cm enhancing mass, and a 2.0 x 1.4 x 3.6 cm area of non-mass enhancement. Left breast was negative. Lymph nodes appear normal.   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 Donne Hazel    11/21/2015 Pathology Results   Right breast mastectomy showed 1.6cm invasive ductal carcinoma, G1, (+) LVI, (+) perineural invasion, one SLN was positive, margins were negative    12/27/2015 - 02/08/2016 Radiation Therapy   Adjuvant radiation to right Chest Wall, Axilla, SCV treated to 50 Gy in 25 fractions, and IM nodes to >45 Gy in 25 fractions, and then chest wall scar boosted an additional 10 Gy in 5 fractions.      CURRENT THERAPY:  Tamoxifen 20 mg once daily, started on 10/13/2015  INTERVAL HISTORY:  Shelly Smith is here for a follow up of right breast cancer. She was able to identify herself by face to face video. She was last seen by me 6 months ago. She presents to the clinic alone. She notes she is doing well. She has been tolerating Tamoxifen well  with no hot flashes. She is still concerned about recent high estrogen levels. She denies recent period or vaginal bleeding. She notes having only discharge and uses panty liners. She denies joint pain or weight change.  She notes she is still looking for PCP. She notes she is currently taking Calcium 540m gummy and Vit D 1000units. She notes her last Pap Smear was 2018 or 2019.    REVIEW OF SYSTEMS:   Constitutional:  Denies fevers, chills or abnormal weight loss Eyes: Denies blurriness of vision Ears, nose, mouth, throat, and face: Denies mucositis or sore throat Respiratory: Denies cough, dyspnea or wheezes Cardiovascular: Denies palpitation, chest discomfort or lower extremity swelling Gastrointestinal:  Denies nausea, heartburn or change in bowel habits Skin: Denies abnormal skin rashes Lymphatics: Denies new lymphadenopathy or easy bruising Neurological:Denies numbness, tingling or new weaknesses Behavioral/Psych: Mood is stable, no new changes  All other systems were reviewed with the patient and are negative.  MEDICAL HISTORY:  Past Medical History:  Diagnosis Date  . [place]  . History of bronchitis   . History of radiation therapy 12/27/15- 02/08/16   Right Chest Wall, Axilla, SCV treated to 50 Gy in 25 fractions, and IM nodes to >45 Gy in 25 fractions, and then chest wall scar boosted an additional 10 Gy in 5 fractions.    SURGICAL HISTORY: Past Surgical History:  Procedure Laterality Date  . MASTECTOMY W/ SENTINEL NODE BIOPSY Right 11/21/2015   Procedure: RIGHT TOTAL MASTECTOMY WITH RIGHT AXILLARY SENTINEL LYMPH NODE BIOPSY;  Surgeon: MRolm Bookbinder MD;  [place]  Service: General;  Laterality: Right;    I have reviewed the social history and family history with the patient and they are unchanged from previous note.  ALLERGIES:  is allergic to no known allergies.  MEDICATIONS:  Current Outpatient Medications  Medication Sig Dispense Refill  . cholecalciferol (VITAMIN D) 1000 units tablet Take 1,000 Units by mouth daily.    . Multiple Vitamin (MULTIVITAMIN WITH MINERALS) TABS tablet Take 1 tablet by mouth daily.    . Probiotic Product (PROBIOTIC ADVANCED PO) Take by mouth.    . tamoxifen (NOLVADEX) 20 MG tablet TAKE 1 TABLET(20 MG) BY MOUTH DAILY 30 tablet 11   No current facility-administered  medications for this visit.    PHYSICAL EXAMINATION: ECOG PERFORMANCE STATUS: 0 - Asymptomatic  No vitals taken today, Exam not performed today  LABORATORY DATA:  I have reviewed the data as listed CBC Latest Ref Rng & Units 07/23/2018 10/14/2017 04/12/2017  WBC 4.0 - 10.5 K/uL 3.5(L) 3.1(L) 3.4(L)  Hemoglobin 12.0 - 15.0 g/dL 14.1 13.1 13.2  Hematocrit 36.0 - 46.0 % 40.2 38.2 37.6  Platelets 150 - 400 K/uL 131(L) 123(L) 131(L)     CMP Latest Ref Rng & Units 07/23/2018 10/14/2017 04/12/2017  Glucose 70 - 99 mg/dL 141(H) 121(H) 82  BUN 6 - 20 mg/dL 13 14 14   Creatinine 0.44 - 1.00 mg/dL 0.79 0.76 0.64  Sodium 135 - 145 mmol/L 141 142 140  Potassium 3.5 - 5.1 mmol/L 3.7 3.9 4.0  Chloride 98 - 111 mmol/L 106 109 107  CO2 22 - 32 mmol/L 24 25 28   Calcium 8.9 - 10.3 mg/dL 8.6(L) 8.4(L) 8.7  Total Protein 6.5 - 8.1 g/dL 7.1 6.4(L) 6.4  Total Bilirubin 0.3 - 1.2 mg/dL 0.4 0.5 0.4  Alkaline Phos 38 - 126 U/L 53 47 -  AST 15 - 41 U/L 23 22 20  ALT 0 - 44 U/L 20 20 18       RADIOGRAPHIC STUDIES: I have personally reviewed the radiological images as listed and agreed with the findings in the report. No results found.   ASSESSMENT & PLAN:  Kanetra Ho is a 54 y.o. female with   1. 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 She is s/p right mastectomy and adjuvant radiation due to 1 positive sentinel lymph nodes was positive.  -The Oncotype Dx showed intermediate risk based on the recurrence score. The benefit of chemotherapy in the intermediate risk group is small and controversial. Given her relatively low score in the intermediate group, I did not recommend adjuvant chemotherapy.  -She started Tamoxifen prior to surgery in 09/2015. We'll continue for 10 years. The alternative option of switching her to aromatase inhibitor for 5 years after she became  postmenopause was also discussed. Her last menstrual period wasin 11/2016. Her [place] in 07/2018 indicated she is still premenopausal. She is apprehensive about switching given bone density. Will repeat her hormonal level on next visit -She is clinically doing well and stable. She continues to tolerate Tamoxifen well with mild vaginal discharge, no periods, weight is stable. I encouraged her to remain active and continue to find a new PCP. Her 09/2018 mammogram was normal.  -I also recommend she stay up to date with her age appropriate cancer screenings.  -Continue breast surveillance. Next mammogram in 09/2019 -Continue Tamoxifen.  -F/u in 6 months with lab    2. Mild leukopenia and thrombocytopenia  -Started after tamoxifen, intermittent, likely secondary to tamoxifen  -We'll continue monitoring.    3. Bone health  -She is considering switching tamoxifen to AI. -Will obtain bone density scan after she became post-menopausal. She is agreeable.  -She will continue oral calcium and Vit D.    Plan -Continue Tamoxifen, she is doing well  -Lab and f/u in 6 months    No problem-specific Assessment & Plan notes found for this encounter.   No orders of the defined types were placed in this encounter.  I discussed the assessment and treatment plan with the patient. The patient was provided an opportunity to ask questions and all were answered. The patient agreed with the plan and demonstrated an understanding of the instructions.  The patient was advised to call back or seek an in-person evaluation if the symptoms worsen or if the condition fails to improve as anticipated.  The total time spent in the appointment was 20 minutes.    Truitt Merle, MD 01/23/2019   I, Joslyn Devon, am acting as scribe for Truitt Merle, MD.   I have reviewed the above documentation for accuracy and completeness, and I agree with the above.

## 2019-01-23 ENCOUNTER — Inpatient Hospital Stay: Payer: 59

## 2019-01-23 ENCOUNTER — Telehealth: Payer: Self-pay | Admitting: Hematology

## 2019-01-23 ENCOUNTER — Inpatient Hospital Stay: Payer: 59 | Attending: Hematology | Admitting: Hematology

## 2019-01-23 ENCOUNTER — Encounter: Payer: Self-pay | Admitting: Hematology

## 2019-01-23 DIAGNOSIS — Z853 Personal history of malignant neoplasm of breast: Secondary | ICD-10-CM

## 2019-01-23 NOTE — Telephone Encounter (Signed)
Scheduled appt per 1/8 sch message - mailed reminder letter with appt date and time

## 2019-01-26 ENCOUNTER — Telehealth: Payer: Self-pay | Admitting: Hematology

## 2019-01-26 NOTE — Telephone Encounter (Signed)
No los per 1/8. 

## 2019-02-16 ENCOUNTER — Emergency Department (HOSPITAL_COMMUNITY)
Admission: EM | Admit: 2019-02-16 | Discharge: 2019-02-17 | Disposition: A | Discharge status: Home / Self Care | Payer: 59 | Attending: Emergency Medicine | Admitting: Emergency Medicine

## 2019-02-16 ENCOUNTER — Encounter (HOSPITAL_COMMUNITY): Payer: Self-pay | Admitting: Emergency Medicine

## 2019-02-16 ENCOUNTER — Emergency Department (HOSPITAL_COMMUNITY): Payer: 59

## 2019-02-16 DIAGNOSIS — Z5321 Procedure and treatment not carried out due to patient leaving prior to being seen by health care provider: Secondary | ICD-10-CM | POA: Insufficient documentation

## 2019-02-16 DIAGNOSIS — Y999 Unspecified external cause status: Secondary | ICD-10-CM | POA: Diagnosis not present

## 2019-02-16 DIAGNOSIS — R55 Syncope and collapse: Secondary | ICD-10-CM | POA: Diagnosis not present

## 2019-02-16 DIAGNOSIS — W07XXXA Fall from chair, initial encounter: Secondary | ICD-10-CM | POA: Diagnosis not present

## 2019-02-16 DIAGNOSIS — Y929 Unspecified place or not applicable: Secondary | ICD-10-CM | POA: Diagnosis not present

## 2019-02-16 DIAGNOSIS — Y9389 Activity, other specified: Secondary | ICD-10-CM | POA: Diagnosis not present

## 2019-02-16 LAB — CBC
HCT: 41.1 % (ref 36.0–46.0)
Hemoglobin: 13.7 g/dL (ref 12.0–15.0)
MCH: 33.1 pg (ref 26.0–34.0)
MCHC: 33.3 g/dL (ref 30.0–36.0)
MCV: 99.3 fL (ref 80.0–100.0)
Platelets: 133 10*3/uL — ABNORMAL LOW (ref 150–400)
RBC: 4.14 MIL/uL (ref 3.87–5.11)
RDW: 11.9 % (ref 11.5–15.5)
WBC: 4.4 10*3/uL (ref 4.0–10.5)
nRBC: 0 % (ref 0.0–0.2)

## 2019-02-16 LAB — BASIC METABOLIC PANEL
Anion gap: 9 (ref 5–15)
BUN: 12 mg/dL (ref 6–20)
CO2: 22 mmol/L (ref 22–32)
Calcium: 8.6 mg/dL — ABNORMAL LOW (ref 8.9–10.3)
Chloride: 106 mmol/L (ref 98–111)
Creatinine, Ser: 0.56 mg/dL (ref 0.44–1.00)
GFR calc Af Amer: >60 mL/min (ref >60)
GFR calc non Af Amer: >60 mL/min (ref >60)
Glucose, Bld: 100 mg/dL — ABNORMAL HIGH (ref 70–99)
Potassium: 3.7 mmol/L (ref 3.5–5.1)
Sodium: 137 mmol/L (ref 135–145)

## 2019-02-16 LAB — I-STAT BETA HCG BLOOD, ED (MC, WL, AP ONLY): I-stat hCG, quantitative: <5 m[IU]/mL (ref <5)

## 2019-02-16 MED ORDER — SODIUM CHLORIDE 0.9% FLUSH: 3.0000 mL | Freq: Once | INTRAVENOUS | Status: DC

## 2019-02-16 NOTE — ED Triage Notes (Signed)
Pt fell from 12ft chair while trying to change a light bulb. Pt does not know recall all events just remembers she was going to change light bulb and then was on floor. Pt arrives in c collar. Husband reports 10 minutes of pt being altered after fall. Pt is alert and ox4 at this time.

## 2019-02-17 LAB — URINALYSIS, ROUTINE W REFLEX MICROSCOPIC
Bilirubin Urine: NEGATIVE
Glucose, UA: NEGATIVE mg/dL
Hgb urine dipstick: NEGATIVE
Ketones, ur: 5 mg/dL — AB
Leukocytes,Ua: NEGATIVE
Nitrite: NEGATIVE
Protein, ur: NEGATIVE mg/dL
Specific Gravity, Urine: 1.009 (ref 1.005–1.030)
pH: 8 (ref 5.0–8.0)

## 2019-06-02 ENCOUNTER — Telehealth: Payer: Self-pay

## 2019-06-02 NOTE — Telephone Encounter (Signed)
I spoke with Shelly Smith and let her know Dr. Ammie Ferrier office will draw the labs we have requested and fax the results to [place].  I told her our lab appt was canceled.  She verbalized understanding.

## 2019-06-02 NOTE — Telephone Encounter (Signed)
I spoke with Dr. Ammie Ferrier staff requesting CBC, CMP, [place] be drawn if they do blood work during Shelly Smith's physical.  I also provided my fax number for results.

## 2019-07-16 NOTE — Progress Notes (Signed)
[place]   Telephone:(336) [phone] Fax:(336) [phone]   Clinic Follow up Note   Patient Care Team: Kathyrn Lass, MD as PCP - General (Family Medicine) Kyung Rudd, MD as Consulting Physician (Radiation Oncology) Truitt Merle, MD as Consulting Physician (Hematology) Erroll Luna, MD as Consulting Physician (General Surgery) Gardenia Phlegm, NP as Nurse Practitioner (Hematology and Oncology)  Date of Service:  07/23/2019  CHIEF COMPLAINT: Follow up right breast cancer  SUMMARY OF ONCOLOGIC HISTORY: Oncology History Overview Note  Breast cancer of lower-inner quadrant of right female breast [facility])   Staging form: Breast, AJCC 7th Edition   - Clinical stage from 10/04/2015: Stage IA (T1c, N0, M0) - Signed by Truitt Merle, MD on 10/12/2015   - Pathologic stage from 11/21/2015: Stage IIA (T1c, N1a, cM0) - Signed by Truitt Merle, MD on 12/12/2015    History of right breast cancer  10/04/2015 Mammogram   Diagnostic mammogram and US showed a 1.6cm lobulated irregular high density mass in the lower inner quadrant right breast, possible chest wall invasion and skin involvement, right axillar [place] (-)   10/05/2015 Initial Diagnosis   Breast cancer of lower-inner quadrant of right female breast ([place])   10/05/2015 Initial Biopsy   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    10/05/2015 Receptors her2   ER 90% +, PR 95%+, strong staining, Her2-, Ki67 10%   10/05/2015 Oncotype testing   Oncotype recurrence score of 19, intermediate risk, predicts 10 year distant recurrence risk of 12% with tamoxifen.   10/13/2015 Imaging   Bilateral breast MRI with and without contrast showed the known 1.7 x 1.7 x 1.0 cm enhancing mass, and a 2.0 x 1.4 x 3.6 cm area of non-mass enhancement. Left breast was negative. Lymph nodes appear normal.   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 Donne Hazel    11/21/2015 Pathology Results   Right breast mastectomy showed 1.6cm invasive ductal carcinoma, G1, (+) LVI, (+) perineural invasion, one SLN was positive, margins were negative    12/27/2015 - 02/08/2016 Radiation Therapy   Adjuvant radiation to right Chest Wall, Axilla, SCV treated to 50 Gy in 25 fractions, and IM nodes to >45 Gy in 25 fractions, and then chest wall scar boosted an additional 10 Gy in 5 fractions.      CURRENT THERAPY:  Tamoxifen 20 mg once daily, started on 10/13/2015  INTERVAL HISTORY:  Shelly Smith is here for a follow up right breast cancer. She had a virtual visit with 6 months ago and seen by me in person 1 year ago. She presents to the clinic alone. She saw her PCP in May 2021. She has not new major changes. She notes she is on Tamoxifen and tolerating well. She notes with 1 batch of Tamoxifen she had skin rash, but has not recurred with subsequent bottles. She denies joint pain. She notes she is still active. She notes she went to ED after fall and loss consciousness. She woke up confused before 911 was called. Work up was clear. She notes 1 spot on her left lower leg has been healing for about a year. She notes right chest tightness from prior surgery. She notes she plans to have a colonoscopy in 10/2019.  REVIEW OF SYSTEMS:   Constitutional: Denies fevers, chills or abnormal weight loss Eyes: Denies blurriness of vision Ears, nose, mouth, throat, and face: Denies mucositis or sore throat Respiratory: Denies cough, dyspnea or wheezes Cardiovascular: Denies palpitation, chest discomfort or lower extremity swelling Gastrointestinal:  Denies nausea, heartburn or change in bowel habits Skin: Denies abnormal skin rashes Lymphatics: Denies new lymphadenopathy or easy bruising Neurological:Denies numbness,  tingling or new weaknesses Behavioral/Psych: Mood is stable, no new changes  All other systems were reviewed with the patient and are negative.  MEDICAL HISTORY:  Past Medical History:  Diagnosis Date  . Breast cancer of lower-inner quadrant of right female breast ([place]) 10/10/2015  . History of bronchitis   . History of radiation therapy 12/27/15- 02/08/16   Right Chest Wall, Axilla, SCV treated to 50 Gy in 25 fractions, and IM nodes to >45 Gy in 25 fractions, and then chest wall scar boosted an additional 10 Gy in 5 fractions.    SURGICAL HISTORY: Past Surgical History:  Procedure Laterality Date  . MASTECTOMY W/ SENTINEL NODE BIOPSY Right 11/21/2015   Procedure: RIGHT TOTAL MASTECTOMY WITH RIGHT AXILLARY SENTINEL LYMPH NODE BIOPSY;  Surgeon: Rolm Bookbinder, MD;  Location: [place];  Service: General;  Laterality: Right;    I have reviewed the social history and family history with the patient and they are unchanged from previous note.  ALLERGIES:  is allergic to no known allergies.  MEDICATIONS:  Current Outpatient Medications  Medication Sig Dispense Refill  . cholecalciferol (VITAMIN D) 1000 units tablet Take 1,000 Units by mouth daily.    . Multiple Vitamin (MULTIVITAMIN WITH MINERALS) TABS tablet Take 1 tablet by mouth daily.    . Probiotic Product (PROBIOTIC ADVANCED PO) Take by mouth.    . tamoxifen (NOLVADEX) 20 MG tablet TAKE 1 TABLET(20 MG) BY MOUTH DAILY 30 tablet 11   No current facility-administered medications for this visit.    PHYSICAL EXAMINATION: ECOG PERFORMANCE STATUS: 0 - Asymptomatic  Vitals:   07/23/19 1419  BP: 112/72  Pulse: 77  Resp: 18  Temp: 97.8 F (36.6 C)  SpO2: 96%   Filed Weights   07/23/19 1419  Weight: 103 lb 1.6 oz (46.8 kg)    GENERAL:alert, no distress and comfortable SKIN: skin color, texture, turgor are normal, no rashes or significant lesions EYES: normal, Conjunctiva are pink and non-injected, sclera clear  NECK: supple,  thyroid normal size, non-tender, without nodularity LYMPH:  no palpable lymphadenopathy in the cervical, axillary  LUNGS: clear to auscultation and percussion with normal breathing effort HEART: regular rate & rhythm and no murmurs and no lower extremity edema ABDOMEN:abdomen soft, non-tender and normal bowel sounds Musculoskeletal:no cyanosis of digits and no clubbing  NEURO: alert & oriented x 3 with fluent speech, no focal motor/sensory deficits BREAST: S/p right mastectomy: Surgical incision healed well. No palpable mass, nodules or adenopathy bilaterally. Breast exam benign.   LABORATORY DATA:  I have reviewed the data as listed CBC Latest Ref Rng & Units 02/16/2019 07/23/2018 10/14/2017  WBC 4.0 - 10.5 K/uL 4.4 3.5(L) 3.1(L)  Hemoglobin 12.0 - 15.0 g/dL 13.7 14.1 13.1  Hematocrit 36 - 46 % 41.1 40.2 38.2  Platelets 150 - 400 K/uL 133(L) 131(L) 123(L)     CMP Latest Ref Rng & Units 02/16/2019 07/23/2018 10/14/2017  Glucose 70 - 99 mg/dL 100(H) 141(H) 121(H)  BUN 6 - 20 mg/dL _0 Creatinine 0.44 - 1.00 mg/dL 0.56 0.79 0.76  Sodium 135 - 145 mmol/L 137  141 142  Potassium 3.5 - 5.1 mmol/L 3.7 3.7 3.9  Chloride 98 - 111 mmol/L 106 106 109  CO2 22 - 32 mmol/L _0 Calcium 8.9 - 10.3 mg/dL 8.6(L) 8.6(L) 8.4(L)  Total Protein 6.5 - 8.1 g/dL - 7.1 6.4(L)  Total Bilirubin 0.3 - 1.2 mg/dL - 0.4 0.5  Alkaline Phos 38 - 126 U/L - 53 47  AST 15 - 41 U/L - 23 22  ALT 0 - 44 U/L - 20 20      RADIOGRAPHIC STUDIES: I have personally reviewed the radiological images as listed and agreed with the findings in the report. No results found.   ASSESSMENT & PLAN:  Shelly Smith is a 54 y.o. female with    1. 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 She is s/p right mastectomy and adjuvant radiation due to 1 positivesentinel lymph nodes was positive.   -The Oncotype Dx showed intermediate risk based on the recurrence score. The benefit of chemotherapy in the intermediate risk group is small and controversial. Given her relatively low score in the intermediate group, I did not recommend adjuvant chemotherapy. -She started Tamoxifen prior to surgery in 09/2015. We'll continue for 10 years. The alternative option of switching her to aromatase inhibitorfor 5 yearsafter she became postmenopause was also discussed. When she becomes postmenopausal will obtain DEXA and determine whether to proceed with AI.  -Her last menstrual period wasin 11/2016. Her [place] in 05/2019 indicated she is still perimenopausal.  -She is clinically doing well. Lab with PCP from 05/2019 reviewed. Her physical exam and her 09/2018 mammogram were unremarkable. There is no clinical concern for recurrence. -Her labs also showed elevated triglycerides. She did not do those labs fasting. I discussed watching her carbohydrate intake.  -Continue breast surveillance. Next mammogram in 09/2019 -Continue Tamoxifen.  -F/u in 6 months. I encouraged her to watch for concerning symptoms of cancer recurrence and we reviewed things to watch.  -she will proceed with screening colonoscopy in 10/2019   2. Mild thrombocytopenia  -Started after tamoxifen, intermittent, likely secondary to tamoxifen  -We'll continue monitoring.Mild and stable.   3. Bone health  -She is consideringswitching tamoxifen toAI. -Will obtain bone density scan after she became post-menopausal. She is agreeable.  -She will continue oral calcium with Vit D. I will check her Vit D level at next visit.     Plan -Continue Tamoxifen -Lab and f/u in 6 months    No problem-specific Assessment & Plan notes found for this encounter.   Orders Placed This Encounter  Procedures  . Vitamin D 25 hydroxy    Standing Status:   Future    Standing Expiration Date:   07/22/2020   All questions were answered. The patient  knows to call the clinic with any problems, questions or concerns. No barriers to learning was detected. The total time spent in the appointment was 25 minutes.     Truitt Merle, MD 07/23/2019   I, Joslyn Devon, am acting as scribe for Truitt Merle, MD.   I have reviewed the above documentation for accuracy and completeness, and I agree with the above.

## 2019-07-23 ENCOUNTER — Encounter: Payer: Self-pay | Admitting: Hematology

## 2019-07-23 ENCOUNTER — Other Ambulatory Visit: Payer: 59

## 2019-07-23 ENCOUNTER — Other Ambulatory Visit: Payer: Self-pay

## 2019-07-23 ENCOUNTER — Telehealth: Payer: Self-pay | Admitting: Hematology

## 2019-07-23 ENCOUNTER — Inpatient Hospital Stay: Payer: 59 | Attending: Hematology | Admitting: Hematology

## 2019-07-23 VITALS — BP 112/72 | HR 77 | Temp 97.8°F | Resp 18 | Ht 64.0 in | Wt 103.1 lb

## 2019-07-23 DIAGNOSIS — Z79810 Long term (current) use of selective estrogen receptor modulators (SERMs): Secondary | ICD-10-CM | POA: Diagnosis not present

## 2019-07-23 DIAGNOSIS — E781 Pure hyperglyceridemia: Secondary | ICD-10-CM | POA: Insufficient documentation

## 2019-07-23 DIAGNOSIS — Z9011 Acquired absence of right breast and nipple: Secondary | ICD-10-CM | POA: Diagnosis not present

## 2019-07-23 DIAGNOSIS — Z853 Personal history of malignant neoplasm of breast: Secondary | ICD-10-CM

## 2019-07-23 DIAGNOSIS — Z79899 Other long term (current) drug therapy: Secondary | ICD-10-CM | POA: Insufficient documentation

## 2019-07-23 DIAGNOSIS — Z923 Personal history of irradiation: Secondary | ICD-10-CM | POA: Diagnosis not present

## 2019-07-23 DIAGNOSIS — D696 Thrombocytopenia, unspecified: Secondary | ICD-10-CM | POA: Insufficient documentation

## 2019-07-23 DIAGNOSIS — C50311 Malignant neoplasm of lower-inner quadrant of right female breast: Secondary | ICD-10-CM | POA: Insufficient documentation

## 2019-07-23 DIAGNOSIS — Z170 Estrogen receptor positive status [ER+]: Secondary | ICD-10-CM | POA: Insufficient documentation

## 2019-07-23 DIAGNOSIS — R21 Rash and other nonspecific skin eruption: Secondary | ICD-10-CM | POA: Insufficient documentation

## 2019-07-23 NOTE — Telephone Encounter (Signed)
Scheduled per 7/8 los. Pt prefers to see YF. No avs or calendar needed to be printed. Pt is aware of appt times and date.

## 2019-10-05 ENCOUNTER — Other Ambulatory Visit: Payer: Self-pay | Admitting: Hematology

## 2019-10-05 DIAGNOSIS — Z170 Estrogen receptor positive status [ER+]: Secondary | ICD-10-CM

## 2019-11-04 ENCOUNTER — Encounter: Payer: Self-pay | Admitting: Hematology

## 2020-01-20 NOTE — Progress Notes (Signed)
Shelly Smith   Telephone:(336) [phone] Fax:(336) [phone]   Clinic Follow up Note   Patient Care Team: Kathyrn Lass, MD as PCP - General (Family Medicine) Kyung Rudd, MD as Consulting Physician (Radiation Oncology) Truitt Merle, MD as Consulting Physician (Hematology) Erroll Luna, MD as Consulting Physician (General Surgery) Gardenia Phlegm, NP as Nurse Practitioner (Hematology and Oncology)  Date of Service:  01/22/2020  I connected with Shelly Smith on 01/22/2020 at  1:40 PM EST by video enabled telemedicine visit and verified that I am speaking with the correct person using two identifiers.  I discussed the limitations, risks, security and privacy concerns of performing an evaluation and management service by telephone and the availability of in person appointments. I also discussed with the patient that there may be a patient responsible charge related to this service. The patient expressed understanding and agreed to proceed.   Other persons participating in the visit and their role in the encounter:  None  Patient's location:  Her home  Provider's location:  My office   CHIEF COMPLAINT: Follow up right breast cancer  SUMMARY OF ONCOLOGIC HISTORY: Oncology History Overview Note  Breast cancer of lower-inner quadrant of right female breast [facility])   Staging form: Breast, AJCC 7th Edition   - Clinical stage from 10/04/2015: Stage IA (T1c, N0, M0) - Signed by Truitt Merle, MD on 10/12/2015   - Pathologic stage from 11/21/2015: Stage IIA (T1c, N1a, cM0) - Signed by Truitt Merle, MD on 12/12/2015    History of right breast cancer  10/04/2015 Mammogram   Diagnostic mammogram and US showed a 1.6cm lobulated irregular high density mass in the lower inner quadrant right breast, possible chest wall invasion and skin involvement, right axillar [place] (-)   10/05/2015 Initial Diagnosis   Breast cancer of lower-inner quadrant of right female breast ([place])   10/05/2015 Initial Biopsy    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    10/05/2015 Receptors her2   ER 90% +, PR 95%+, strong staining, Her2-, Ki67 10%   10/05/2015 Oncotype testing   Oncotype recurrence score of 19, intermediate risk, predicts 10 year distant recurrence risk of 12% with tamoxifen.   10/13/2015 Imaging   Bilateral breast MRI with and without contrast showed the known 1.7 x 1.7 x 1.0 cm enhancing mass, and a 2.0 x 1.4 x 3.6 cm area of non-mass enhancement. Left breast was negative. Lymph nodes appear normal.   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 Donne Hazel    11/21/2015 Pathology Results   Right breast mastectomy showed 1.6cm invasive ductal carcinoma, G1, (+) LVI, (+) perineural invasion, one SLN was positive, margins were negative    12/27/2015 - 02/08/2016 Radiation Therapy   Adjuvant radiation to right Chest Wall, Axilla, SCV treated to 50 Gy in 25 fractions, and IM nodes to >45 Gy in 25 fractions, and then chest wall scar boosted an additional 10 Gy in 5 fractions.      CURRENT THERAPY:  Tamoxifen 20 mg once daily, started on 10/13/2015  INTERVAL HISTORY:  Shelly Smith is here for a follow up of right breast cancer. She was last seen by me 6 months ago. She was able to identify herself by face to  face video. She notes having colonoscopy in 10/2019 at [facility] and results were clean. She notes since procedure her BM feels like she still has gas. She had 09/2019 Mammogram was negative. She denies any chest wall problems or pain. She notes she has gained a few pounds with recent holidays. She denies no other new pain. She is on Tamoxifen and tolerating well and changed to brand name for the past 3 months. She last saw her PCP in 05/2019.      REVIEW OF SYSTEMS:   Constitutional: Denies fevers, chills or abnormal weight loss Eyes: Denies blurriness of vision Ears, nose, mouth, throat, and face: Denies mucositis or sore throat Respiratory: Denies cough, dyspnea or wheezes Cardiovascular: Denies palpitation, chest discomfort or lower extremity swelling Gastrointestinal:  Denies nausea, heartburn or change in bowel habits Skin: Denies abnormal skin rashes Lymphatics: Denies new lymphadenopathy or easy bruising Neurological:Denies numbness, tingling or new weaknesses Behavioral/Psych: Mood is stable, no new changes  All other systems were reviewed with the patient and are negative.  MEDICAL HISTORY:  Past Medical History:  Diagnosis Date  . Breast cancer of lower-inner quadrant of right female breast ([place]) 10/10/2015  . History of bronchitis   . History of radiation therapy 12/27/15- 02/08/16   Right Chest Wall, Axilla, SCV treated to 50 Gy in 25 fractions, and IM nodes to >45 Gy in 25 fractions, and then chest wall scar boosted an additional 10 Gy in 5 fractions.    SURGICAL HISTORY: Past Surgical History:  Procedure Laterality Date  . MASTECTOMY W/ SENTINEL NODE BIOPSY Right 11/21/2015   Procedure: RIGHT TOTAL MASTECTOMY WITH RIGHT AXILLARY SENTINEL LYMPH NODE BIOPSY;  Surgeon: Rolm Bookbinder, MD;  Location: [place];  Service: General;  Laterality: Right;    I have reviewed the social history and family history with the patient and they are unchanged from previous note.  ALLERGIES:  is allergic to no known allergies.  MEDICATIONS:  Current Outpatient Medications  Medication Sig Dispense Refill  . cholecalciferol (VITAMIN D) 1000 units tablet Take 1,000 Units by mouth daily.    . Multiple Vitamin (MULTIVITAMIN WITH MINERALS) TABS tablet Take 1 tablet by mouth daily.    . Probiotic Product (PROBIOTIC ADVANCED PO) Take by mouth.    . tamoxifen (NOLVADEX) 20 MG tablet TAKE 1 TABLET(20 MG) BY MOUTH DAILY 30 tablet 11    No current facility-administered medications for this visit.    PHYSICAL EXAMINATION: ECOG PERFORMANCE STATUS: 0 - Asymptomatic  No vitals taken today, Exam not performed today  LABORATORY DATA:  I have reviewed the data as listed CBC Latest Ref Rng & Units 02/16/2019 07/23/2018 10/14/2017  WBC 4.0 - 10.5 K/uL 4.4 3.5(L) 3.1(L)  Hemoglobin 12.0 - 15.0 g/dL 13.7 14.1 13.1  Hematocrit 36.0 - 46.0 % 41.1 40.2 38.2  Platelets 150 - 400 K/uL 133(L) 131(L) 123(L)     CMP Latest Ref Rng & Units 02/16/2019 07/23/2018 10/14/2017  Glucose 70 - 99 mg/dL 100(H) 141(H) 121(H)  BUN 6 - 20 mg/dL _0 Creatinine 0.44 - 1.00 mg/dL 0.56 0.79 0.76  Sodium 135 - 145 mmol/L 137 141 142  Potassium 3.5 - 5.1 mmol/L 3.7 3.7 3.9  Chloride 98 - 111 mmol/L 106 106 109  CO2 22 - 32 mmol/L _1 Calcium 8.9 - 10.3 mg/dL 8.6(L) 8.6(L) 8.4(L)  Total Protein 6.5 - 8.1 g/dL - 7.1 6.4(L)  Total Bilirubin 0.3 - 1.2 mg/dL - 0.4 0.5  Alkaline Phos 38 -  126 U/L - 53 47  AST 15 - 41 U/L - 23 22  ALT 0 - 44 U/L - 20 20      RADIOGRAPHIC STUDIES: I have personally reviewed the radiological images as listed and agreed with the findings in the report. No results found.   ASSESSMENT & PLAN:  Shelly Smith is a 55 y.o. female with   1. 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 She is s/p right mastectomy and adjuvant radiation due to 1 positivesentinel lymph nodes was positive.  -The Oncotype Dxshowedintermediate risk based on the recurrence score.Given her relatively low score in the intermediate group, I did not recommend adjuvant chemotherapy. -She started Tamoxifen prior to surgery in 09/2015. We'll continue for 10 years. When she becomes postmenopausal will obtain DEXA and determine whether to proceed with AI for 5 years as another option.  -Her last menstrual period  wasin 11/2016.Her [place] in 05/2019 indicated she is still perimenopausal. -She is clinically doing well. Her 09/2019 mammogram was unremarkable. There is no clinical concern for recurrence. -Continue breast surveillance. Next mammogram in 09/2020 -Continue Tamoxifen. -F/u in October 2022, she will see her PCP in May 2022   2. Mild thrombocytopenia  -Started after tamoxifen, intermittent, likely secondary to tamoxifen -We'll continue monitoring.Mild and stable.   3. Bone health   -Will obtain baseline DEXAafter she becomes post-menopausal. She is agreeable. -She will continue oral calcium with Vit D. I will check her Vit D level at next visit.    Plan -Obtain 10/2019 colonoscopy from [facility].  -Continue Tamoxifen -Lab and f/u in October 2022 -Mammogram in 09/2020 -Lab with PCP in 05/2020. She will send results in Mychart.    No problem-specific Assessment & Plan notes found for this encounter.   Orders Placed This Encounter  Procedures  . MM Digital Screening Unilat L    Standing Status:   Future    Standing Expiration Date:   01/21/2021    Scheduling Instructions:     [facility]    Order Specific Question:   Reason for Exam (SYMPTOM  OR DIAGNOSIS REQUIRED)    Answer:   screening    Order Specific Question:   Is the patient pregnant?    Answer:   No    Order Specific Question:   Preferred imaging location?    Answer:   External    Order Specific Question:   Release to patient    Answer:   Immediate   I discussed the assessment and treatment plan with the patient. The patient was provided an opportunity to ask questions and all were answered. The patient agreed with the plan and demonstrated an understanding of the instructions.  The patient was advised to call back or seek an in-person evaluation if the symptoms worsen or if the condition fails to improve as anticipated.  The total time spent in the appointment was 20 minutes.     Truitt Merle, MD 01/22/2020   I, Joslyn Devon, am acting as scribe for Truitt Merle, MD.   I have reviewed the above documentation for accuracy and completeness, and I agree with the above.

## 2020-01-22 ENCOUNTER — Inpatient Hospital Stay: Payer: 59

## 2020-01-22 ENCOUNTER — Inpatient Hospital Stay: Payer: 59 | Attending: Hematology | Admitting: Hematology

## 2020-01-22 ENCOUNTER — Encounter: Payer: Self-pay | Admitting: Hematology

## 2020-01-22 DIAGNOSIS — Z79899 Other long term (current) drug therapy: Secondary | ICD-10-CM | POA: Diagnosis not present

## 2020-01-22 DIAGNOSIS — Z170 Estrogen receptor positive status [ER+]: Secondary | ICD-10-CM | POA: Insufficient documentation

## 2020-01-22 DIAGNOSIS — Z1231 Encounter for screening mammogram for malignant neoplasm of breast: Secondary | ICD-10-CM | POA: Diagnosis not present

## 2020-01-22 DIAGNOSIS — C50311 Malignant neoplasm of lower-inner quadrant of right female breast: Secondary | ICD-10-CM | POA: Diagnosis present

## 2020-01-22 DIAGNOSIS — D696 Thrombocytopenia, unspecified: Secondary | ICD-10-CM | POA: Insufficient documentation

## 2020-01-22 DIAGNOSIS — Z853 Personal history of malignant neoplasm of breast: Secondary | ICD-10-CM | POA: Diagnosis not present

## 2020-01-22 DIAGNOSIS — Z79810 Long term (current) use of selective estrogen receptor modulators (SERMs): Secondary | ICD-10-CM | POA: Insufficient documentation

## 2020-01-23 ENCOUNTER — Encounter: Payer: Self-pay | Admitting: Hematology

## 2020-01-26 ENCOUNTER — Telehealth: Payer: Self-pay | Admitting: Hematology

## 2020-01-26 NOTE — Telephone Encounter (Signed)
Scheduled appts per 1/7 los. Left voicemail with appt date/time.

## 2020-07-07 ENCOUNTER — Encounter: Payer: Self-pay | Admitting: Hematology

## 2020-10-06 ENCOUNTER — Other Ambulatory Visit: Payer: Self-pay | Admitting: Hematology

## 2020-10-06 DIAGNOSIS — Z170 Estrogen receptor positive status [ER+]: Secondary | ICD-10-CM

## 2020-10-26 ENCOUNTER — Encounter: Payer: Self-pay | Admitting: Hematology

## 2020-10-26 ENCOUNTER — Inpatient Hospital Stay: Payer: 59 | Attending: Hematology | Admitting: Hematology

## 2020-10-26 ENCOUNTER — Other Ambulatory Visit: Payer: Self-pay

## 2020-10-26 ENCOUNTER — Other Ambulatory Visit: Payer: 59

## 2020-10-26 VITALS — BP 100/68 | HR 89 | Temp 97.8°F | Resp 17 | Ht 64.0 in | Wt 111.2 lb

## 2020-10-26 DIAGNOSIS — D696 Thrombocytopenia, unspecified: Secondary | ICD-10-CM | POA: Diagnosis not present

## 2020-10-26 DIAGNOSIS — Z79899 Other long term (current) drug therapy: Secondary | ICD-10-CM | POA: Diagnosis not present

## 2020-10-26 DIAGNOSIS — C50311 Malignant neoplasm of lower-inner quadrant of right female breast: Secondary | ICD-10-CM | POA: Diagnosis not present

## 2020-10-26 DIAGNOSIS — Z170 Estrogen receptor positive status [ER+]: Secondary | ICD-10-CM | POA: Insufficient documentation

## 2020-10-26 DIAGNOSIS — Z853 Personal history of malignant neoplasm of breast: Secondary | ICD-10-CM | POA: Diagnosis not present

## 2020-10-26 NOTE — Progress Notes (Signed)
[facility]   [phone] [phone]   Clinic Follow up Note   Patient Care Team: Kathyrn Lass, MD as PCP - General (Family Medicine) Kyung Rudd, MD as Consulting Physician (Radiation Oncology) Truitt Merle, MD as Consulting Physician (Hematology) Erroll Luna, MD as Consulting Physician (General Surgery) Delice Bison Charlestine Massed, NP as Nurse Practitioner (Hematology and Oncology)  Date of Service:  10/26/2020  CHIEF COMPLAINT: f/u of right breast cancer  CURRENT THERAPY:  Tamoxifen 20 mg once daily, started on 10/13/2015  ASSESSMENT & PLAN:  Shelly Smith is a 55 y.o. female with   1. 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 She is s/p right mastectomy and adjuvant radiation due to 1 positive sentinel lymph nodes was positive.  -The Oncotype Dx showed intermediate risk based on the recurrence score. Given her relatively low score in the intermediate group, I did not recommend adjuvant chemotherapy.  -She started Tamoxifen prior to surgery in 09/2015. We'll continue for 10 years. When she becomes postmenopausal will obtain DEXA and determine whether to proceed with AI for 5 years as another option.  -Her last menstrual period was in 11/2016. Her [place] in 05/2019 indicated she is still perimenopausal. -She is clinically doing well.  No pain or other concerns.  She is tolerating tamoxifen very well without any noticeable side effects. -she had mammogram last week,will get the report  -Continue breast surveillance. Next mammogram in 09/2020 -Continue Tamoxifen.    2. Mild thrombocytopenia  -Started after tamoxifen, intermittent, likely secondary to tamoxifen  -We'll continue monitoring. Mild and stable.     3. Bone health   -Will obtain baseline DEXA after she becomes post-menopausal. She is agreeable.  -She will  continue oral calcium with Vit D.      Plan -Continue Tamoxifen -Lab and f/u in Feb 2023, then yearly. She sees her PCP in July -will obtain her last mammogram from [facility]    No problem-specific [place] notes found for this encounter.   SUMMARY OF ONCOLOGIC HISTORY: Oncology History Overview Note  Breast cancer of lower-inner quadrant of right female breast [facility])   Staging form: Breast, AJCC 7th Edition   - Clinical stage from 10/04/2015: Stage IA (T1c, N0, M0) - Signed by Truitt Merle, MD on 10/12/2015   - Pathologic stage from 11/21/2015: Stage IIA (T1c, N1a, cM0) - Signed by Truitt Merle, MD on 12/12/2015    History of right breast cancer  10/04/2015 Mammogram   Diagnostic mammogram and US showed a 1.6cm lobulated irregular high density mass in the lower inner quadrant right breast, possible chest wall invasion and skin involvement, right axillar [place] (-)   10/05/2015 Initial Diagnosis   Breast cancer of lower-inner quadrant of right female breast ([place])   10/05/2015 Initial Biopsy   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    10/05/2015 Receptors her2   ER 90% +, PR 95%+, strong staining, Her2-, Ki67 10%   10/05/2015 Oncotype testing   Oncotype recurrence score of 19, intermediate risk, predicts 10 year distant recurrence risk of 12% with tamoxifen.   10/13/2015 Imaging   Bilateral breast MRI with and without contrast showed the known 1.7 x 1.7 x 1.0 cm enhancing mass, and a 2.0 x 1.4 x 3.6 cm area of non-mass enhancement. Left breast was negative. Lymph nodes appear normal.   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 Donne Hazel    11/21/2015 Pathology Results   Right breast mastectomy showed 1.6cm invasive ductal  carcinoma, G1, (+) LVI, (+) perineural invasion, one SLN was positive, margins were negative    12/27/2015 - 02/08/2016 Radiation Therapy   Adjuvant radiation to right Chest Wall, Axilla, SCV treated to 50 Gy in 25 fractions, and IM nodes to >45 Gy in 25 fractions, and then chest wall scar boosted an additional 10 Gy in 5 fractions.      INTERVAL HISTORY:  Shelly Smith is here for a follow up of breast cancer. She was last seen by me on 07/23/19 with phone visit in the interim. She presents to the clinic alone.  She is clinically doing very well, denies any pain or other new symptoms.  She tolerates tamoxifen without noticeable side effects.  She remains to be physically active, but has not been exercise as frequently as before the pandemic.  She has gained a few pounds.   All other systems were reviewed with the patient and are negative.  MEDICAL HISTORY:  Past Medical History:  Diagnosis Date   Breast cancer of lower-inner quadrant of right female breast ([place]) 10/10/2015   History of bronchitis    History of radiation therapy 12/27/15- 02/08/16   Right Chest Wall, Axilla, SCV treated to 50 Gy in 25 fractions, and IM nodes to >45 Gy in 25 fractions, and then chest wall scar boosted an additional 10 Gy in 5 fractions.    SURGICAL HISTORY: Past Surgical History:  Procedure Laterality Date   MASTECTOMY W/ SENTINEL NODE BIOPSY Right 11/21/2015   Procedure: RIGHT TOTAL MASTECTOMY WITH RIGHT AXILLARY SENTINEL LYMPH NODE BIOPSY;  Surgeon: Rolm Bookbinder, MD;  Location: [place];  Service: General;  Laterality: Right;    I have reviewed the social history and family history with the patient and they are unchanged from previous note.  ALLERGIES:  is allergic to no known allergies.  MEDICATIONS:  Current Outpatient Medications  Medication Sig Dispense Refill   cholecalciferol (VITAMIN D) 1000 units tablet Take 1,000 Units by mouth daily.     Multiple Vitamin (MULTIVITAMIN WITH MINERALS) TABS  tablet Take 1 tablet by mouth daily.     Probiotic Product (PROBIOTIC ADVANCED PO) Take by mouth.     tamoxifen (NOLVADEX) 20 MG tablet TAKE 1 TABLET(20 MG) BY MOUTH DAILY 30 tablet 11   No current facility-administered medications for this visit.    PHYSICAL EXAMINATION: ECOG PERFORMANCE STATUS: 0 - Asymptomatic  Vitals:   10/26/20 1327  BP: 100/68  Pulse: 89  Resp: 17  Temp: 97.8 F (36.6 C)  SpO2: 98%   Wt Readings from Last 3 Encounters:  10/26/20 111 lb 3.2 oz (50.4 kg)  07/23/19 103 lb 1.6 oz (46.8 kg)  02/16/19 105 lb (47.6 kg)     GENERAL:alert, no distress and comfortable SKIN: skin color, texture, turgor are normal, no rashes or significant lesions EYES: normal, Conjunctiva are pink and non-injected, sclera clear  NECK: supple, thyroid normal size, non-tender, without nodularity LYMPH:  no palpable lymphadenopathy in the cervical, axillary LUNGS: clear to auscultation and percussion with normal breathing effort HEART: regular rate & rhythm and no murmurs and no lower extremity edema ABDOMEN:abdomen soft, non-tender and  normal bowel sounds Musculoskeletal:no cyanosis of digits and no clubbing  NEURO: alert & oriented x 3 with fluent speech, no focal motor/sensory deficits BREAST: Status post right mastectomy.  No palpable mass, nodules or adenopathy bilaterally. Breast exam benign.   LABORATORY DATA:  I have reviewed the data as listed CBC Latest Ref Rng & Units 02/16/2019 07/23/2018 10/14/2017  WBC 4.0 - 10.5 K/uL 4.4 3.5(L) 3.1(L)  Hemoglobin 12.0 - 15.0 g/dL 13.7 14.1 13.1  Hematocrit 36.0 - 46.0 % 41.1 40.2 38.2  Platelets 150 - 400 K/uL 133(L) 131(L) 123(L)     CMP Latest Ref Rng & Units 02/16/2019 07/23/2018 10/14/2017  Glucose 70 - 99 mg/dL 100(H) 141(H) 121(H)  BUN 6 - 20 mg/dL 12 13 14   Creatinine 0.44 - 1.00 mg/dL 0.56 0.79 0.76  Sodium 135 - 145 mmol/L 137 141 142  Potassium 3.5 - 5.1 mmol/L 3.7 3.7 3.9  Chloride 98 - 111 mmol/L 106 106 109  CO2 22 - 32  mmol/L 22 24 25   Calcium 8.9 - 10.3 mg/dL 8.6(L) 8.6(L) 8.4(L)  Total Protein 6.5 - 8.1 g/dL - 7.1 6.4(L)  Total Bilirubin 0.3 - 1.2 mg/dL - 0.4 0.5  Alkaline Phos 38 - 126 U/L - 53 47  AST 15 - 41 U/L - 23 22  ALT 0 - 44 U/L - 20 20      RADIOGRAPHIC STUDIES: I have personally reviewed the radiological images as listed and agreed with the findings in the report. No results found.    No orders of the defined types were placed in this encounter.  All questions were answered. The patient knows to call the clinic with any problems, questions or concerns. No barriers to learning was detected. The total time spent in the appointment was 25 minutes.     Truitt Merle, MD 10/26/2020   I, Wilburn Mylar, am acting as scribe for Truitt Merle, MD.   I have reviewed the above documentation for accuracy and completeness, and I agree with the above.

## 2021-02-15 ENCOUNTER — Inpatient Hospital Stay (HOSPITAL_BASED_OUTPATIENT_CLINIC_OR_DEPARTMENT_OTHER): Payer: 59 | Admitting: Hematology

## 2021-02-15 ENCOUNTER — Inpatient Hospital Stay: Payer: 59 | Attending: Hematology

## 2021-02-15 ENCOUNTER — Encounter: Payer: Self-pay | Admitting: Hematology

## 2021-02-15 ENCOUNTER — Other Ambulatory Visit: Payer: Self-pay | Admitting: Hematology

## 2021-02-15 ENCOUNTER — Other Ambulatory Visit: Payer: Self-pay

## 2021-02-15 VITALS — BP 118/72 | HR 87 | Temp 98.0°F | Resp 17 | Ht 64.0 in | Wt 109.9 lb

## 2021-02-15 DIAGNOSIS — D696 Thrombocytopenia, unspecified: Secondary | ICD-10-CM | POA: Insufficient documentation

## 2021-02-15 DIAGNOSIS — Z170 Estrogen receptor positive status [ER+]: Secondary | ICD-10-CM | POA: Insufficient documentation

## 2021-02-15 DIAGNOSIS — Z853 Personal history of malignant neoplasm of breast: Secondary | ICD-10-CM

## 2021-02-15 DIAGNOSIS — C50311 Malignant neoplasm of lower-inner quadrant of right female breast: Secondary | ICD-10-CM | POA: Insufficient documentation

## 2021-02-15 DIAGNOSIS — Z79899 Other long term (current) drug therapy: Secondary | ICD-10-CM | POA: Diagnosis not present

## 2021-02-15 LAB — COMPREHENSIVE METABOLIC PANEL
ALT: 22 U/L (ref 0–44)
AST: 21 U/L (ref 15–41)
Albumin: 4.1 g/dL (ref 3.5–5.0)
Alkaline Phosphatase: 53 U/L (ref 38–126)
Anion gap: 5 (ref 5–15)
BUN: 18 mg/dL (ref 6–20)
CO2: 29 mmol/L (ref 22–32)
Calcium: 9 mg/dL (ref 8.9–10.3)
Chloride: 105 mmol/L (ref 98–111)
Creatinine, Ser: 0.69 mg/dL (ref 0.44–1.00)
GFR, Estimated: >60 mL/min (ref >60)
Glucose, Bld: 115 mg/dL — ABNORMAL HIGH (ref 70–99)
Potassium: 3.9 mmol/L (ref 3.5–5.1)
Sodium: 139 mmol/L (ref 135–145)
Total Bilirubin: 0.3 mg/dL (ref 0.3–1.2)
Total Protein: 6.8 g/dL (ref 6.5–8.1)

## 2021-02-15 LAB — CBC WITH DIFFERENTIAL/PLATELET
Abs Immature Granulocytes: 0.01 10*3/uL (ref 0.00–0.07)
Basophils Absolute: 0 10*3/uL (ref 0.0–0.1)
Basophils Relative: 0 %
Eosinophils Absolute: 0.1 10*3/uL (ref 0.0–0.5)
Eosinophils Relative: 1 %
HCT: 39.4 % (ref 36.0–46.0)
Hemoglobin: 13.7 g/dL (ref 12.0–15.0)
Immature Granulocytes: 0 %
Lymphocytes Relative: 33 %
Lymphs Abs: 1.7 10*3/uL (ref 0.7–4.0)
MCH: 32.7 pg (ref 26.0–34.0)
MCHC: 34.8 g/dL (ref 30.0–36.0)
MCV: 94 fL (ref 80.0–100.0)
Monocytes Absolute: 0.4 10*3/uL (ref 0.1–1.0)
Monocytes Relative: 7 %
Neutro Abs: 2.9 10*3/uL (ref 1.7–7.7)
Neutrophils Relative %: 59 %
Platelets: 151 10*3/uL (ref 150–400)
RBC: 4.19 MIL/uL (ref 3.87–5.11)
RDW: 11.9 % (ref 11.5–15.5)
WBC: 5 10*3/uL (ref 4.0–10.5)
nRBC: 0 % (ref 0.0–0.2)

## 2021-02-15 NOTE — Progress Notes (Signed)
[facility]hel[facility]   Telephone:(336) [phone] Fax:(336) [phone]   Clinic Follow up Note   Patient Care Team: Kathyrn Lass, MD as PCP - General (Family Medicine) Kyung Rudd, MD as Consulting Physician (Radiation Oncology) Truitt Merle, MD as Consulting Physician (Hematology) Erroll Luna, MD as Consulting Physician (General Surgery) Delice Bison Charlestine Massed, NP as Nurse Practitioner (Hematology and Oncology)  Date of Service:  02/15/2021  CHIEF COMPLAINT: f/u of right breast cancer  CURRENT THERAPY:  Tamoxifen 20 mg once daily, started on 10/13/2015  ASSESSMENT & PLAN:  Shelly Smith is a 56 y.o. female with   1. 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 She is s/p right mastectomy and adjuvant radiation due to 1 positive sentinel lymph node. -She started Tamoxifen prior to surgery in 09/2015. We'll continue for 10 years.  -Her last menstrual period was in 11/2016. Her [place] in 05/2019 indicated she is still perimenopausal. -most recent left mammogram on 10/17/20 at [facility] was negative. -She is clinically doing well.  No pain or other concerns.  She is tolerating tamoxifen very well without any noticeable side effects. -Continue breast surveillance. Next mammogram in 10/2021 -Continue Tamoxifen.    2. Mild thrombocytopenia  -Started after tamoxifen, intermittent, likely secondary to tamoxifen  -Mild and stable. Plt WNL at 151k today (02/15/21)   3. Bone health   -I recommend baseline DEXA this year -She will continue oral calcium with Vit D.  -Continue weightbearing exercise.     Plan -Continue Tamoxifen -next mammogram in 10/2021 -lab and f/u in 1 year -Baseline DEXA scan this year, she will discuss with her family doctor   No problem-specific Assessment & Plan notes found for this encounter.   SUMMARY OF ONCOLOGIC  HISTORY: Oncology History Overview Note  Breast cancer of lower-inner quadrant of right female breast [facility])   Staging form: Breast, AJCC 7th Edition   - Clinical stage from 10/04/2015: Stage IA (T1c, N0, M0) - Signed by Truitt Merle, MD on 10/12/2015   - Pathologic stage from 11/21/2015: Stage IIA (T1c, N1a, cM0) - Signed by Truitt Merle, MD on 12/12/2015    History of right breast cancer  10/04/2015 Mammogram   Diagnostic mammogram and US showed a 1.6cm lobulated irregular high density mass in the lower inner quadrant right breast, possible chest wall invasion and skin involvement, right axillar [place] (-)   10/05/2015 Initial Diagnosis   Breast cancer of lower-inner quadrant of right female breast ([place])   10/05/2015 Initial Biopsy   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    10/05/2015 Receptors her2   ER 90% +, PR 95%+, strong staining, Her2-, Ki67 10%   10/05/2015 Oncotype testing   Oncotype recurrence score of 19, intermediate risk, predicts 10 year distant recurrence risk of 12% with tamoxifen.   10/13/2015 Imaging   Bilateral breast MRI with and without contrast showed the known 1.7 x 1.7 x 1.0 cm enhancing mass, and a 2.0 x 1.4 x 3.6 cm area of non-mass enhancement. Left breast was negative. Lymph nodes appear normal.   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 Donne Hazel  11/21/2015 Pathology Results   Right breast mastectomy showed 1.6cm invasive ductal carcinoma, G1, (+) LVI, (+) perineural invasion, one SLN was positive, margins were negative    12/27/2015 - 02/08/2016 Radiation Therapy   Adjuvant radiation to right Chest Wall, Axilla, SCV treated to 50 Gy in 25 fractions, and IM nodes to >45 Gy in 25 fractions, and then chest  wall scar boosted an additional 10 Gy in 5 fractions.      INTERVAL HISTORY:  Shelly Smith is here for a follow up of breast cancer. She was last seen by me on 10/26/20. She presents to the clinic alone.  She is clinically doing very well, tolerating tamoxifen very well without noticeable side effects.  She denies any pain, or other new symptoms.   All other systems were reviewed with the patient and are negative.  MEDICAL HISTORY:  Past Medical History:  Diagnosis Date   Breast cancer of lower-inner quadrant of right female breast ([place]) 10/10/2015   History of bronchitis    History of radiation therapy 12/27/15- 02/08/16   Right Chest Wall, Axilla, SCV treated to 50 Gy in 25 fractions, and IM nodes to >45 Gy in 25 fractions, and then chest wall scar boosted an additional 10 Gy in 5 fractions.    SURGICAL HISTORY: Past Surgical History:  Procedure Laterality Date   MASTECTOMY W/ SENTINEL NODE BIOPSY Right 11/21/2015   Procedure: RIGHT TOTAL MASTECTOMY WITH RIGHT AXILLARY SENTINEL LYMPH NODE BIOPSY;  Surgeon: Rolm Bookbinder, MD;  Location: [place];  Service: General;  Laterality: Right;    I have reviewed the social history and family history with the patient and they are unchanged from previous note.  ALLERGIES:  is allergic to no known allergies.  MEDICATIONS:  Current Outpatient Medications  Medication Sig Dispense Refill   cholecalciferol (VITAMIN D) 1000 units tablet Take 1,000 Units by mouth daily.     Multiple Vitamin (MULTIVITAMIN WITH MINERALS) TABS tablet Take 1 tablet by mouth daily.     Probiotic Product (PROBIOTIC ADVANCED PO) Take by mouth.     tamoxifen (NOLVADEX) 20 MG tablet TAKE 1 TABLET(20 MG) BY MOUTH DAILY 30 tablet 11   No current facility-administered medications for this visit.    PHYSICAL EXAMINATION: ECOG PERFORMANCE STATUS: 0 - Asymptomatic  Vitals:   02/15/21 1353  BP: 118/72  Pulse: 87  Resp: 17  Temp: 98 F (36.7 C)  SpO2: 97%   Wt Readings  from Last 3 Encounters:  02/15/21 109 lb 14.4 oz (49.9 kg)  10/26/20 111 lb 3.2 oz (50.4 kg)  07/23/19 103 lb 1.6 oz (46.8 kg)     GENERAL:alert, no distress and comfortable SKIN: skin color, texture, turgor are normal, no rashes or significant lesions EYES: normal, Conjunctiva are pink and non-injected, sclera clear  NECK: supple, thyroid normal size, non-tender, without nodularity LYMPH:  no palpable lymphadenopathy in the cervical, axillary  LUNGS: clear to auscultation and percussion with normal breathing effort HEART: regular rate & rhythm and no murmurs and no lower extremity edema ABDOMEN:abdomen soft, non-tender and normal bowel sounds Musculoskeletal:no cyanosis of digits and no clubbing  NEURO: alert & oriented x 3 with fluent speech, no focal motor/sensory deficits BREAST: Status post right mastectomy.  No palpable mass, nodules or adenopathy bilaterally. Breast exam benign.   LABORATORY DATA:  I have reviewed the data as listed CBC Latest Ref Rng & Units 02/15/2021 02/16/2019 07/23/2018  WBC 4.0 - 10.5 K/uL 5.0 4.4 3.5(L)  Hemoglobin 12.0 - 15.0 g/dL 13.7  13.7 14.1  Hematocrit 36.0 - 46.0 % 39.4 41.1 40.2  Platelets 150 - 400 K/uL 151 133(L) 131(L)     CMP Latest Ref Rng & Units 02/15/2021 02/16/2019 07/23/2018  Glucose 70 - 99 mg/dL 115(H) 100(H) 141(H)  BUN 6 - 20 mg/dL _0 Creatinine 0.44 - 1.00 mg/dL 0.69 0.56 0.79  Sodium 135 - 145 mmol/L 139 137 141  Potassium 3.5 - 5.1 mmol/L 3.9 3.7 3.7  Chloride 98 - 111 mmol/L 105 106 106  CO2 22 - 32 mmol/L _1 Calcium 8.9 - 10.3 mg/dL 9.0 8.6(L) 8.6(L)  Total Protein 6.5 - 8.1 g/dL 6.8 - 7.1  Total Bilirubin 0.3 - 1.2 mg/dL 0.3 - 0.4  Alkaline Phos 38 - 126 U/L 53 - 53  AST 15 - 41 U/L 21 - 23  ALT 0 - 44 U/L 22 - 20      RADIOGRAPHIC STUDIES: I have personally reviewed the radiological images as listed and agreed with the findings in the report. No results found.    No orders of the defined types were placed  in this encounter.  All questions were answered. The patient knows to call the clinic with any problems, questions or concerns. No barriers to learning was detected. The total time spent in the appointment was 25 minutes.     Truitt Merle, MD 02/15/2021   I, Wilburn Mylar, am acting as scribe for Truitt Merle, MD.   I have reviewed the above documentation for accuracy and completeness, and I agree with the above.

## 2021-07-19 ENCOUNTER — Encounter: Payer: Self-pay | Admitting: Hematology

## 2021-07-21 ENCOUNTER — Other Ambulatory Visit: Payer: Self-pay

## 2021-07-21 DIAGNOSIS — Z853 Personal history of malignant neoplasm of breast: Secondary | ICD-10-CM

## 2021-07-21 NOTE — Progress Notes (Signed)
Per Dr. Burr Medico, please request a CBC w/Diff and CMP to be drawn at pt's f/u appt with Dr. Sabra Heck (PCP).  Epic faxed orders to Dr. Sabra Heck.  Requested lab results to be faxed to Dr. Ernestina Penna office.  Epic fax confirmation received.

## 2021-10-06 ENCOUNTER — Other Ambulatory Visit: Payer: Self-pay | Admitting: Hematology

## 2021-10-06 DIAGNOSIS — Z170 Estrogen receptor positive status [ER+]: Secondary | ICD-10-CM

## 2021-10-19 ENCOUNTER — Encounter: Payer: Self-pay | Admitting: Hematology

## 2022-02-14 ENCOUNTER — Other Ambulatory Visit: Payer: 59

## 2022-02-14 ENCOUNTER — Ambulatory Visit: Payer: 59 | Admitting: Hematology

## 2022-02-15 ENCOUNTER — Inpatient Hospital Stay: Payer: 59 | Attending: Hematology

## 2022-02-15 ENCOUNTER — Inpatient Hospital Stay (HOSPITAL_BASED_OUTPATIENT_CLINIC_OR_DEPARTMENT_OTHER): Payer: 59 | Admitting: Hematology

## 2022-02-15 ENCOUNTER — Other Ambulatory Visit: Payer: Self-pay

## 2022-02-15 ENCOUNTER — Encounter: Payer: Self-pay | Admitting: Hematology

## 2022-02-15 VITALS — BP 111/82 | HR 83 | Temp 97.8°F | Resp 15 | Ht 64.0 in | Wt 113.8 lb

## 2022-02-15 DIAGNOSIS — E2839 Other primary ovarian failure: Secondary | ICD-10-CM

## 2022-02-15 DIAGNOSIS — Z923 Personal history of irradiation: Secondary | ICD-10-CM | POA: Insufficient documentation

## 2022-02-15 DIAGNOSIS — Z79810 Long term (current) use of selective estrogen receptor modulators (SERMs): Secondary | ICD-10-CM | POA: Diagnosis not present

## 2022-02-15 DIAGNOSIS — Z853 Personal history of malignant neoplasm of breast: Secondary | ICD-10-CM | POA: Diagnosis not present

## 2022-02-15 DIAGNOSIS — C50311 Malignant neoplasm of lower-inner quadrant of right female breast: Secondary | ICD-10-CM | POA: Insufficient documentation

## 2022-02-15 DIAGNOSIS — Z170 Estrogen receptor positive status [ER+]: Secondary | ICD-10-CM | POA: Insufficient documentation

## 2022-02-15 DIAGNOSIS — R600 Localized edema: Secondary | ICD-10-CM | POA: Diagnosis not present

## 2022-02-15 LAB — CBC WITH DIFFERENTIAL/PLATELET
Abs Immature Granulocytes: 0.01 10*3/uL (ref 0.00–0.07)
Basophils Absolute: 0 10*3/uL (ref 0.0–0.1)
Basophils Relative: 0 %
Eosinophils Absolute: 0.1 10*3/uL (ref 0.0–0.5)
Eosinophils Relative: 1 %
HCT: 40.5 % (ref 36.0–46.0)
Hemoglobin: 14.4 g/dL (ref 12.0–15.0)
Immature Granulocytes: 0 %
Lymphocytes Relative: 39 %
Lymphs Abs: 1.8 10*3/uL (ref 0.7–4.0)
MCH: 33.5 pg (ref 26.0–34.0)
MCHC: 35.6 g/dL (ref 30.0–36.0)
MCV: 94.2 fL (ref 80.0–100.0)
Monocytes Absolute: 0.4 10*3/uL (ref 0.1–1.0)
Monocytes Relative: 8 %
Neutro Abs: 2.3 10*3/uL (ref 1.7–7.7)
Neutrophils Relative %: 52 %
Platelets: 149 10*3/uL — ABNORMAL LOW (ref 150–400)
RBC: 4.3 MIL/uL (ref 3.87–5.11)
RDW: 12 % (ref 11.5–15.5)
WBC: 4.6 10*3/uL (ref 4.0–10.5)
nRBC: 0 % (ref 0.0–0.2)

## 2022-02-15 LAB — COMPREHENSIVE METABOLIC PANEL
ALT: 27 U/L (ref 0–44)
AST: 28 U/L (ref 15–41)
Albumin: 4.1 g/dL (ref 3.5–5.0)
Alkaline Phosphatase: 59 U/L (ref 38–126)
Anion gap: 8 (ref 5–15)
BUN: 14 mg/dL (ref 6–20)
CO2: 27 mmol/L (ref 22–32)
Calcium: 9.3 mg/dL (ref 8.9–10.3)
Chloride: 105 mmol/L (ref 98–111)
Creatinine, Ser: 0.65 mg/dL (ref 0.44–1.00)
GFR, Estimated: >60 mL/min (ref >60)
Glucose, Bld: 151 mg/dL — ABNORMAL HIGH (ref 70–99)
Potassium: 3.9 mmol/L (ref 3.5–5.1)
Sodium: 140 mmol/L (ref 135–145)
Total Bilirubin: 0.3 mg/dL (ref 0.3–1.2)
Total Protein: 7.1 g/dL (ref 6.5–8.1)

## 2022-02-15 NOTE — Assessment & Plan Note (Signed)
pT1cN1aM0, stage IIA, ER/PR strongly positive, HER-2 negative, grade 2, Oncotype RS 19 -She was diagnosed in 09/2015. She is s/p right mastectomy and adjuvant radiation due to 1 positive sentinel lymph node. -She started Tamoxifen prior to surgery in 09/2015. We'll continue for 10 years.  -Her last menstrual period was in 11/2016. Her [place] in 05/2019 indicated she is still perimenopausal. -most recent left mammogram on 10/17/20 at [facility] was negative. -She is clinically doing well.  No pain or other concerns.  She is tolerating tamoxifen very well without any noticeable side effects. -Continue breast surveillance. Next mammogram in 10/2021 -Continue Tamoxifen.

## 2022-02-15 NOTE — Progress Notes (Signed)
[place]   [phone] Fax:(336) [phone]   Clinic Follow up Note   Patient Care Team: Kathyrn Lass, MD as PCP - General (Family Medicine) Kyung Rudd, MD as Consulting Physician (Radiation Oncology) Truitt Merle, MD as Consulting Physician (Hematology) Erroll Luna, MD as Consulting Physician (General Surgery) Delice Bison Charlestine Massed, NP as Nurse Practitioner (Hematology and Oncology)  Date of Service:  02/15/2022  CHIEF COMPLAINT: f/u of  right breast cancer   CURRENT THERAPY:  Tamoxifen 20 mg once daily, started on 10/13/2015   ASSESSMENT:  Shelly Smith is a 57 y.o. female with   History of right breast cancer  pT1cN1aM0, stage IIA, ER/PR strongly positive, HER-2 negative, grade 2, Oncotype RS 19 -She was diagnosed in 09/2015. She is s/p right mastectomy and adjuvant radiation due to 1 positive sentinel lymph node. -She started Tamoxifen prior to surgery in 09/2015. We'll continue for 10 years.  -Her last menstrual period was in 11/2016. Her [place] in 05/2019 indicated she is still perimenopausal. -most recent left mammogram on 10/17/20 at [facility] was negative. -She is clinically doing well.  No pain or other concerns.  She is tolerating tamoxifen very well without any noticeable side effects. -Continue breast surveillance. Next mammogram in 10/2022 -Continue Tamoxifen.  She is clinically doing very well, exam unremarkable, no clinical concern for recurrence.    PLAN: -Lab reviewed -Continue tamoxifen, I will refill for her -Lab and follow-up in 1 year -She will follow-up with her PCP in July -Mammogram in October at the [facility], will get a baseline bone density scan for her at the same time.    SUMMARY OF ONCOLOGIC HISTORY: Oncology History Overview Note  Breast cancer of lower-inner quadrant of right female breast [facility])   Staging form: Breast, AJCC 7th Edition   - Clinical stage from 10/04/2015: Stage IA (T1c, N0, M0) - Signed by Truitt Merle, MD on  10/12/2015   - Pathologic stage from 11/21/2015: Stage IIA (T1c, N1a, cM0) - Signed by Truitt Merle, MD on 12/12/2015    History of right breast cancer  10/04/2015 Mammogram   Diagnostic mammogram and US showed a 1.6cm lobulated irregular high density mass in the lower inner quadrant right breast, possible chest wall invasion and skin involvement, right axillar [place] (-)   10/05/2015 Initial Diagnosis   Breast cancer of lower-inner quadrant of right female breast ([place])   10/05/2015 Initial Biopsy   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    10/05/2015 Receptors her2   ER 90% +, PR 95%+, strong staining, Her2-, Ki67 10%   10/05/2015 Oncotype testing   Oncotype recurrence score of 19, intermediate risk, predicts 10 year distant recurrence risk of 12% with tamoxifen.   10/13/2015 Imaging   Bilateral breast MRI with and without contrast showed the known 1.7 x 1.7 x 1.0 cm enhancing mass, and a 2.0 x 1.4 x 3.6 cm area of non-mass enhancement. Left breast was negative. Lymph nodes appear normal.   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 Donne Hazel    11/21/2015 Pathology Results   Right breast mastectomy showed 1.6cm invasive ductal carcinoma, G1, (+) LVI, (+) perineural invasion, one SLN was positive, margins were negative    12/27/2015 - 02/08/2016 Radiation Therapy  Adjuvant radiation to right Chest Wall, Axilla, SCV treated to 50 Gy in 25 fractions, and IM nodes to >45 Gy in 25 fractions, and then chest wall scar boosted an additional 10 Gy in 5 fractions.      INTERVAL HISTORY:  Shelly Smith is here for a follow up of  right breast cancer  She was last seen by me on 02/15/2021 She presents to the clinic alone.  She is clinically doing  very well, no pain or other concerns.  She traveled to [place] in October 2023, and developed bilateral lower extremity edema after the long flight, and subsequently developed blisters in her feet, more on the left, she was seen by dermatologist in [place], and was diagnosed with eczema.  This has resolved completely after she returned to the [place].  She is back to her routine normal activities, including dancing twice a week.  She is tolerating tamoxifen well, no hot flashes or other noticeable side effects.    All other systems were reviewed with the patient and are negative.  MEDICAL HISTORY:  Past Medical History:  Diagnosis Date   Breast cancer of lower-inner quadrant of right female breast ([place]) 10/10/2015   History of bronchitis    History of radiation therapy 12/27/15- 02/08/16   Right Chest Wall, Axilla, SCV treated to 50 Gy in 25 fractions, and IM nodes to >45 Gy in 25 fractions, and then chest wall scar boosted an additional 10 Gy in 5 fractions.    SURGICAL HISTORY: Past Surgical History:  Procedure Laterality Date   MASTECTOMY W/ SENTINEL NODE BIOPSY Right 11/21/2015   Procedure: RIGHT TOTAL MASTECTOMY WITH RIGHT AXILLARY SENTINEL LYMPH NODE BIOPSY;  Surgeon: Rolm Bookbinder, MD;  Location: [facility];  Service: General;  Laterality: Right;    I have reviewed the social history and family history with the patient and they are unchanged from previous note.  ALLERGIES:  is allergic to no known allergies.  MEDICATIONS:  Current Outpatient Medications  Medication Sig Dispense Refill   cholecalciferol (VITAMIN D) 1000 units tablet Take 1,000 Units by mouth daily.     Multiple Vitamin (MULTIVITAMIN WITH MINERALS) TABS tablet Take 1 tablet by mouth daily.     Probiotic Product (PROBIOTIC ADVANCED PO) Take by mouth.     tamoxifen (NOLVADEX) 20 MG tablet TAKE 1 TABLET(20 MG) BY MOUTH DAILY 30 tablet 11   No current facility-administered medications for this visit.    PHYSICAL  EXAMINATION: ECOG PERFORMANCE STATUS: 0 - Asymptomatic  Vitals:   02/15/22 1312  BP: 111/82  Pulse: 83  Resp: 15  Temp: 97.8 F (36.6 C)  SpO2: 99%   Wt Readings from Last 3 Encounters:  02/15/22 113 lb 12.8 oz (51.6 kg)  02/15/21 109 lb 14.4 oz (49.9 kg)  10/26/20 111 lb 3.2 oz (50.4 kg)    GENERAL:alert, no distress and comfortable SKIN: skin color, texture, turgor are normal, no rashes or significant lesions EYES: normal, Conjunctiva are pink and non-injected, sclera clear NECK: (-)supple, thyroid normal size, non-tender, without nodularity LYMPH:  (-) no palpable lymphadenopathy in the cervical, axillary  ABDOMEN:(-) abdomen soft, non-tender and normal bowel sounds Musculoskeletal:no cyanosis of digits and no clubbing  BREAST: RT Breast Mastectomy, no palpable mass , breast exam benign. LT Breast no palpable mass, breast exam benign.    LABORATORY DATA:  I have reviewed the data as listed    Latest Ref Rng & Units 02/15/2022   12:53 PM 02/15/2021    1:43 PM 02/16/2019  7:11 PM  CBC  WBC 4.0 - 10.5 K/uL 4.6  5.0  4.4   Hemoglobin 12.0 - 15.0 g/dL 14.4  13.7  13.7   Hematocrit 36.0 - 46.0 % 40.5  39.4  41.1   Platelets 150 - 400 K/uL 149  151  133         Latest Ref Rng & Units 02/15/2022   12:53 PM 02/15/2021    1:43 PM 02/16/2019    7:11 PM  CMP  Glucose 70 - 99 mg/dL 151  115  100   BUN 6 - 20 mg/dL '14  18  12   '$ Creatinine 0.44 - 1.00 mg/dL 0.65  0.69  0.56   Sodium 135 - 145 mmol/L 140  139  137   Potassium 3.5 - 5.1 mmol/L 3.9  3.9  3.7   Chloride 98 - 111 mmol/L 105  105  106   CO2 22 - 32 mmol/L '27  29  22   '$ Calcium 8.9 - 10.3 mg/dL 9.3  9.0  8.6   Total Protein 6.5 - 8.1 g/dL 7.1  6.8    Total Bilirubin 0.3 - 1.2 mg/dL 0.3  0.3    Alkaline Phos 38 - 126 U/L 59  53    AST 15 - 41 U/L 28  21    ALT 0 - 44 U/L 27  22        RADIOGRAPHIC STUDIES: I have personally reviewed the radiological images as listed and agreed with the findings in the report. No  results found.    Orders Placed This Encounter  Procedures   DG Bone Density    Standing Status:   Future    Standing Expiration Date:   02/15/2023    Scheduling Instructions:     [facility]    Order Specific Question:   Reason for Exam (SYMPTOM  OR DIAGNOSIS REQUIRED)    Answer:   screening    Order Specific Question:   Is patient pregnant?    Answer:   No    Order Specific Question:   Preferred imaging location?    Answer:   External   All questions were answered. The patient knows to call the clinic with any problems, questions or concerns. No barriers to learning was detected. The total time spent in the appointment was 20 minutes.     Truitt Merle, MD 02/15/2022   Felicity Coyer, CMA, am acting as scribe for Truitt Merle, MD.   I have reviewed the above documentation for accuracy and completeness, and I agree with the above.

## 2022-05-25 ENCOUNTER — Encounter: Payer: Self-pay | Admitting: Hematology

## 2022-10-05 ENCOUNTER — Other Ambulatory Visit: Payer: Self-pay | Admitting: Hematology

## 2022-10-05 DIAGNOSIS — C50311 Malignant neoplasm of lower-inner quadrant of right female breast: Secondary | ICD-10-CM

## 2022-10-25 ENCOUNTER — Encounter: Payer: Self-pay | Admitting: Hematology

## 2022-10-26 ENCOUNTER — Encounter: Payer: Self-pay | Admitting: Hematology

## 2023-02-15 ENCOUNTER — Other Ambulatory Visit: Payer: Self-pay

## 2023-02-15 DIAGNOSIS — Z853 Personal history of malignant neoplasm of breast: Secondary | ICD-10-CM

## 2023-02-17 NOTE — Assessment & Plan Note (Signed)
pT1cN1aM0, stage IIA, ER/PR strongly positive, HER-2 negative, grade 2, Oncotype RS 19 -She was diagnosed in 09/2015. She is s/p right mastectomy and adjuvant radiation due to 1 positive sentinel lymph node. -She started Tamoxifen prior to surgery in 09/2015. We'll continue for 10 years.  -Her last menstrual period was in 11/2016. Her [place] in 05/2019 indicated she is still perimenopausal. -most recent left mammogram on 10/17/20 at [facility] was negative. -She is clinically doing well.  No pain or other concerns.  She is tolerating tamoxifen very well without any noticeable side effects. -Continue breast surveillance. Next mammogram in 10/2021 -Continue Tamoxifen.

## 2023-02-18 ENCOUNTER — Encounter: Payer: Self-pay | Admitting: Hematology

## 2023-02-18 ENCOUNTER — Inpatient Hospital Stay: Payer: 59 | Attending: Hematology | Admitting: Hematology

## 2023-02-18 ENCOUNTER — Inpatient Hospital Stay: Payer: 59

## 2023-02-18 VITALS — BP 103/67 | HR 87 | Temp 97.6°F | Resp 16 | Wt 112.9 lb

## 2023-02-18 DIAGNOSIS — Z170 Estrogen receptor positive status [ER+]: Secondary | ICD-10-CM | POA: Diagnosis not present

## 2023-02-18 DIAGNOSIS — Z9011 Acquired absence of right breast and nipple: Secondary | ICD-10-CM | POA: Insufficient documentation

## 2023-02-18 DIAGNOSIS — Z79810 Long term (current) use of selective estrogen receptor modulators (SERMs): Secondary | ICD-10-CM | POA: Diagnosis not present

## 2023-02-18 DIAGNOSIS — Z79899 Other long term (current) drug therapy: Secondary | ICD-10-CM | POA: Insufficient documentation

## 2023-02-18 DIAGNOSIS — Z853 Personal history of malignant neoplasm of breast: Secondary | ICD-10-CM

## 2023-02-18 DIAGNOSIS — C50311 Malignant neoplasm of lower-inner quadrant of right female breast: Secondary | ICD-10-CM | POA: Diagnosis not present

## 2023-02-18 DIAGNOSIS — N6314 Unspecified lump in the right breast, lower inner quadrant: Secondary | ICD-10-CM | POA: Diagnosis not present

## 2023-02-18 DIAGNOSIS — Z1721 Progesterone receptor positive status: Secondary | ICD-10-CM | POA: Insufficient documentation

## 2023-02-18 DIAGNOSIS — Z923 Personal history of irradiation: Secondary | ICD-10-CM | POA: Diagnosis not present

## 2023-02-18 DIAGNOSIS — M810 Age-related osteoporosis without current pathological fracture: Secondary | ICD-10-CM | POA: Insufficient documentation

## 2023-02-18 LAB — CBC WITH DIFFERENTIAL (CANCER CENTER ONLY)
Abs Immature Granulocytes: 0.01 10*3/uL (ref 0.00–0.07)
Basophils Absolute: 0 10*3/uL (ref 0.0–0.1)
Basophils Relative: 0 %
Eosinophils Absolute: 0 10*3/uL (ref 0.0–0.5)
Eosinophils Relative: 1 %
HCT: 40.2 % (ref 36.0–46.0)
Hemoglobin: 13.9 g/dL (ref 12.0–15.0)
Immature Granulocytes: 0 %
Lymphocytes Relative: 41 %
Lymphs Abs: 2 10*3/uL (ref 0.7–4.0)
MCH: 32.5 pg (ref 26.0–34.0)
MCHC: 34.6 g/dL (ref 30.0–36.0)
MCV: 93.9 fL (ref 80.0–100.0)
Monocytes Absolute: 0.3 10*3/uL (ref 0.1–1.0)
Monocytes Relative: 7 %
Neutro Abs: 2.4 10*3/uL (ref 1.7–7.7)
Neutrophils Relative %: 51 %
Platelet Count: 152 10*3/uL (ref 150–400)
RBC: 4.28 MIL/uL (ref 3.87–5.11)
RDW: 12.1 % (ref 11.5–15.5)
WBC Count: 4.8 10*3/uL (ref 4.0–10.5)
nRBC: 0 % (ref 0.0–0.2)

## 2023-02-18 LAB — CMP (CANCER CENTER ONLY)
ALT: 23 U/L (ref 0–44)
AST: 26 U/L (ref 15–41)
Albumin: 4.2 g/dL (ref 3.5–5.0)
Alkaline Phosphatase: 44 U/L (ref 38–126)
Anion gap: 6 (ref 5–15)
BUN: 13 mg/dL (ref 6–20)
CO2: 29 mmol/L (ref 22–32)
Calcium: 9.2 mg/dL (ref 8.9–10.3)
Chloride: 106 mmol/L (ref 98–111)
Creatinine: 0.75 mg/dL (ref 0.44–1.00)
GFR, Estimated: >60 mL/min (ref >60)
Glucose, Bld: 110 mg/dL — ABNORMAL HIGH (ref 70–99)
Potassium: 3.8 mmol/L (ref 3.5–5.1)
Sodium: 141 mmol/L (ref 135–145)
Total Bilirubin: 0.3 mg/dL (ref 0.0–1.2)
Total Protein: 6.7 g/dL (ref 6.5–8.1)

## 2023-02-18 MED ORDER — TAMOXIFEN CITRATE 20 MG PO TABS: 20.0000 mg | ORAL_TABLET | Freq: Every day | ORAL | 11 refills | Status: AC

## 2023-02-18 NOTE — Progress Notes (Signed)
[facility]   Telephone:(336) [phone] Fax:(336) [phone]   Clinic Follow up Note   Patient Care Team: Sigmund Hazel, MD as PCP - General (Family Medicine) Dorothy Puffer, MD as Consulting Physician (Radiation Oncology) Malachy Mood, MD as Consulting Physician (Hematology) Harriette Bouillon, MD as Consulting Physician (General Surgery) Loa Socks, NP as Nurse Practitioner (Hematology and Oncology)  Date of Service:  02/18/2023  CHIEF COMPLAINT: f/u of right breast cancer  CURRENT THERAPY:  Tamoxifen 20 mg daily  Oncology History   History of right breast cancer  pT1cN1aM0, stage IIA, ER/PR strongly positive, HER-2 negative, grade 2, Oncotype RS 19 -She was diagnosed in 09/2015. She is s/p right mastectomy and adjuvant radiation due to 1 positive sentinel lymph node. -She started Tamoxifen prior to surgery in 09/2015. We'll continue for 10 years.  -Her last menstrual period was in 11/2016. Her FSH in 05/2019 indicated she is still perimenopausal. -most recent left mamm[facility] on 10/17/20 [facility] was negative. -She is clinically doing well.  No pain or other concerns.  She is tolerating tamoxifen very well without any noticeable side effects. -Continue breast surveillance. Next mammogram in 10/2021 -Continue Tamoxifen.    Assessment and Plan    Breast Cancer (Follow-up) Diagnosed in September 2017. Currently on Tamoxifen, to continue for a total of 10 years. No recent symptoms suggestive of recurrence. October mammogram was negative. 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 - Continue Tamoxifen for 2.5 more years - Annual mammogram - Consider breast MRI for more sensitive screening  Osteoporosis T-score of -2.5 at the left hip. Currently on calcium and vitamin D supplements. Discussed benefits of weight-bearing exercises like dancing. Considered Zometa  infusion for reducing bone metastasis in breast cancer patients, but currently on Alendronate. Discussed small risk of osteonecrosis of the jaw with bisphosphonates and need to pause treatment for dental procedures. - Continue calcium and vitamin D supplements - Continue Alendronate - Engage in weight-bearing exercises like dancing - Consider Zometa infusion if not responding to current treatment  General Health Maintenance Discussed importance of regular screenings and lifestyle modifications. Active and engages in dancing, beneficial for overall health. - Continue regular physical activity   Plan -Is clinically doing well, no concern for breast cancer recurrence -Continue tamoxifen 20 mg daily - Follow-up in one year for breast cancer monitoring -Left screening mammogram in October 2024, I also discussed option of breast MRI for breast cancer screening due to her dense breast tissue and previous DCIS.  She wants to think about it.       SUMMARY OF ONCOLOGIC HISTORY: Oncology History Overview Note  Breast cancer of lower-inner quadrant of right female breast [facility])   Staging form: Breast, AJCC 7th Edition   - Clinical stage from 10/04/2015: Stage IA (T1c, N0, M0) - Signed by Malachy Mood, MD on 10/12/2015   - Pathologic stage from 11/21/2015: Stage IIA (T1c, N1a, cM0) - Signed by Malachy Mood, MD on 12/12/2015    History of right breast cancer  10/04/2015 Mammogram   Diagnostic mammogram and US showed a 1.6cm lobulated irregular high density mass in the lower inner quadrant right breast, possible chest wall invasion and skin involvement, right axillar [place] (-)   10/05/2015 Initial Diagnosis   Breast cancer of lower-inner quadrant of right female breast ([facility])   10/05/2015 Initial Biopsy   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  10/05/2015 Receptors her2   ER 90% +, PR 95%+, strong staining, Her2-, Ki67 10%   10/05/2015 Oncotype testing   Oncotype recurrence score  of 19, intermediate risk, predicts 10 year distant recurrence risk of 12% with tamoxifen.   10/13/2015 Imaging   Bilateral breast MRI with and without contrast showed the known 1.7 x 1.7 x 1.0 cm enhancing mass, and a 2.0 x 1.4 x 3.6 cm area of non-mass enhancement. Left breast was negative. Lymph nodes appear normal.   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 Dwain Sarna    11/21/2015 Pathology Results   Right breast mastectomy showed 1.6cm invasive ductal carcinoma, G1, (+) LVI, (+) perineural invasion, one SLN was positive, margins were negative    12/27/2015 - 02/08/2016 Radiation Therapy   Adjuvant radiation to right Chest Wall, Axilla, SCV treated to 50 Gy in 25 fractions, and IM nodes to >45 Gy in 25 fractions, and then chest wall scar boosted an additional 10 Gy in 5 fractions.      Discussed the use of AI scribe software for clinical note transcription with the patient, who gave verbal consent to proceed.  History of Present Illness   The patient, a woman in her fifties with a history of breast cancer, presents for a routine follow-up. She reports no current breast-related symptoms and her last mammogram in October was normal. She has been on Tamoxifen for about seven and a half years, with two and a half years remaining in her treatment plan.  The patient was recently diagnosed with osteoporosis, with a T-score of -2.5, and has started taking calcium supplements. She reports no current discomfort or symptoms related to this condition. She is concerned about her bone health as she is thin and her mother, who is in her eighties, has never had a fracture.  The patient is also concerned about her heart and lung health. She mentions a friend who had  significant blockages in her arteries discovered during a health check in [place]. The patient is considering having a similar check done. She also mentions a previous EKG done at a church event that showed some abnormalities, but she has not followed up on this with her primary care provider.  The patient is active and regularly participates in dance for exercise. She has not noticed any symptoms such as shortness of breath or chest pain during these activities.         All other systems were reviewed with the patient and are negative.  MEDICAL HISTORY:  Past Medical History:  Diagnosis Date   Breast cancer of lower-inner quadrant of right female breast ([facility]) 10/10/2015   History of bronchitis    History of radiation therapy 12/27/15- 02/08/16   Right Chest Wall, Axilla, SCV treated to 50 Gy in 25 fractions, and IM nodes to >45 Gy in 25 fractions, and then chest wall scar boosted an additional 10 Gy in 5 fractions.    SURGICAL HISTORY: Past Surgical History:  Procedure Laterality Date   MASTECTOMY W/ SENTINEL NODE BIOPSY Right 11/21/2015   Procedure: RIGHT TOTAL MASTECTOMY WITH RIGHT AXILLARY SENTINEL LYMPH NODE BIOPSY;  Surgeon: Emelia Loron, MD;  Location: [facility];  Service: General;  Laterality: Right;    I have reviewed the social  history and family history with the patient and they are unchanged from previous note.  ALLERGIES:  is allergic to no known allergies.  MEDICATIONS:  Current Outpatient Medications  Medication Sig Dispense Refill   Probiotic Product (PROBIOTIC ADVANCED PO) Take by mouth.     tamoxifen (NOLVADEX) 20 MG tablet Take 1 tablet (20 mg total) by mouth daily. 30 tablet 11   No current facility-administered medications for this visit.    PHYSICAL EXAMINATION: ECOG PERFORMANCE STATUS: 0 - Asymptomatic  Vitals:   02/18/23 1328  BP: 103/67  Pulse: 87  Resp: 16  Temp: 97.6 F (36.4 C)  SpO2: 99%   Wt Readings from Last 3 Encounters:  02/18/23 112 lb  14.4 oz (51.2 kg)  02/15/22 113 lb 12.8 oz (51.6 kg)  02/15/21 109 lb 14.4 oz (49.9 kg)     GENERAL:alert, no distress and comfortable SKIN: skin color, texture, turgor are normal, no rashes or significant lesions EYES: normal, Conjunctiva are pink and non-injected, sclera clear NECK: supple, thyroid normal size, non-tender, without nodularity LYMPH:  no palpable lymphadenopathy in the cervical, axillary  LUNGS: clear to auscultation and percussion with normal breathing effort HEART: regular rate & rhythm and no murmurs and no lower extremity edema ABDOMEN:abdomen soft, non-tender and normal bowel sounds Musculoskeletal:no cyanosis of digits and no clubbing  NEURO: alert & oriented x 3 with fluent speech, no focal motor/sensory deficits Breast: Status post a right mastectomy without palpable nodule on chest wall or axilla, left breast exam was normal, no axillary adenopathy.  LABORATORY DATA:  I have reviewed the data as listed    Latest Ref Rng & Units 02/18/2023    1:18 PM 02/15/2022   12:53 PM 02/15/2021    1:43 PM  CBC  WBC 4.0 - 10.5 K/uL 4.8  4.6  5.0   Hemoglobin 12.0 - 15.0 g/dL 10.2  72.5  36.6   Hematocrit 36.0 - 46.0 % 40.2  40.5  39.4   Platelets 150 - 400 K/uL 152  149  151         Latest Ref Rng & Units 02/18/2023    1:18 PM 02/15/2022   12:53 PM 02/15/2021    1:43 PM  CMP  Glucose 70 - 99 mg/dL 440  347  425   BUN 6 - 20 mg/dL 13  14  18    Creatinine 0.44 - 1.00 mg/dL 9.56  3.87  5.64   Sodium 135 - 145 mmol/L 141  140  139   Potassium 3.5 - 5.1 mmol/L 3.8  3.9  3.9   Chloride 98 - 111 mmol/L 106  105  105   CO2 22 - 32 mmol/L 29  27  29    Calcium 8.9 - 10.3 mg/dL 9.2  9.3  9.0   Total Protein 6.5 - 8.1 g/dL 6.7  7.1  6.8   Total Bilirubin 0.0 - 1.2 mg/dL 0.3  0.3  0.3   Alkaline Phos 38 - 126 U/L 44  59  53   AST 15 - 41 U/L 26  28  21    ALT 0 - 44 U/L 23  27  22        RADIOGRAPHIC STUDIES: I have personally reviewed the radiological images as listed and  agreed with the findings in the report. No results found.    Orders Placed This Encounter  Procedures   MM Digital Screening Unilat L    Standing Status:   Future    Expected Date:   10/23/2023  Expiration Date:   02/18/2024    Scheduling Instructions:     [facility]    Reason for Exam (SYMPTOM  OR DIAGNOSIS REQUIRED):   screening    Is the patient pregnant?:   No    Preferred imaging location?:   External   All questions were answered. The patient knows to call the clinic with any problems, questions or concerns. No barriers to learning was detected. The total time spent in the appointment was 30 minutes.     Malachy Mood, MD 02/18/2023

## 2023-05-19 ENCOUNTER — Emergency Department (HOSPITAL_BASED_OUTPATIENT_CLINIC_OR_DEPARTMENT_OTHER)

## 2023-05-19 ENCOUNTER — Emergency Department (HOSPITAL_BASED_OUTPATIENT_CLINIC_OR_DEPARTMENT_OTHER)
Admission: EM | Admit: 2023-05-19 | Discharge: 2023-05-20 | Disposition: A | Discharge status: Home / Self Care | Attending: Emergency Medicine | Admitting: Emergency Medicine

## 2023-05-19 ENCOUNTER — Encounter (HOSPITAL_BASED_OUTPATIENT_CLINIC_OR_DEPARTMENT_OTHER): Payer: Self-pay

## 2023-05-19 DIAGNOSIS — S02401A Maxillary fracture, unspecified, initial encounter for closed fracture: Secondary | ICD-10-CM | POA: Diagnosis not present

## 2023-05-19 DIAGNOSIS — W19XXXA Unspecified fall, initial encounter: Secondary | ICD-10-CM

## 2023-05-19 DIAGNOSIS — S0232XA Fracture of orbital floor, left side, initial encounter for closed fracture: Secondary | ICD-10-CM | POA: Insufficient documentation

## 2023-05-19 DIAGNOSIS — S0292XA Unspecified fracture of facial bones, initial encounter for closed fracture: Secondary | ICD-10-CM

## 2023-05-19 DIAGNOSIS — R040 Epistaxis: Secondary | ICD-10-CM | POA: Insufficient documentation

## 2023-05-19 DIAGNOSIS — Y9341 Activity, dancing: Secondary | ICD-10-CM | POA: Diagnosis not present

## 2023-05-19 DIAGNOSIS — W010XXA Fall on same level from slipping, tripping and stumbling without subsequent striking against object, initial encounter: Secondary | ICD-10-CM | POA: Insufficient documentation

## 2023-05-19 DIAGNOSIS — R519 Headache, unspecified: Secondary | ICD-10-CM | POA: Diagnosis present

## 2023-05-19 DIAGNOSIS — S0240FA Zygomatic fracture, left side, initial encounter for closed fracture: Secondary | ICD-10-CM | POA: Insufficient documentation

## 2023-05-19 MED ORDER — OXYMETAZOLINE HCL 0.05 % NA SOLN
1.0000 | Freq: Once | NASAL | Status: AC
  Administered 2023-05-19: 1 via NASAL
  Filled 2023-05-19: qty 30

## 2023-05-19 NOTE — ED Provider Notes (Signed)
 [place] EMERGENCY DEPARTMENT AT [facility] Provider Note   CSN: [id number] Arrival date & time: 05/19/23  2057     History  Chief Complaint  Patient presents with   Fall   Epistaxis    Shelly Smith is a 58 y.o. female.   Fall  Epistaxis    58 year old female presenting to the emergency department with a ground-level mechanical fall.  The patient states that she was dancing on a floor and was wearing socks only when she slipped landing on the left side of her face.  She landed on the left side of her body.  She sustained pain and bruising with associated nosebleed.  Nosebleed started approximately 30 minutes prior to arrival and has since resolved after Afrin.  She endorses pain along the left side of her face with associated bruising and swelling.  She endorses some bruising and discomfort along her left hip.  She is not on anticoagulation, she arrives GCS 15, ABC intact.  Home Medications Prior to Admission medications   Medication Sig Start Date End Date Taking? Authorizing Provider  HYDROcodone-acetaminophen  (NORCO/VICODIN) 5-325 MG tablet Take 1-2 tablets by mouth every 6 (six) hours as needed. 05/20/23  Yes Rosealee Concha, MD  Probiotic Product (PROBIOTIC ADVANCED PO) Take by mouth.    [provider]  tamoxifen  (NOLVADEX ) 20 MG tablet Take 1 tablet (20 mg total) by mouth daily. 02/18/23   Sonja [place], MD      Allergies    No known allergies    Review of Systems   Review of Systems  HENT:  Positive for facial swelling and nosebleeds.     Physical Exam Updated Vital Signs BP 137/79 (BP Location: Right Arm)   Pulse 83   Temp 98.7 F (37.1 C) (Oral)   Resp 15   SpO2 99%  Physical Exam Vitals and nursing note reviewed.  Constitutional:      General: She is not in acute distress.    Appearance: She is well-developed.     Comments: GCS 15, ABC intact  HENT:     Head: Normocephalic and atraumatic.     Nose:     Comments: No nasal septal hematoma,  epistaxis appears to have resolved with dried blood in the naris bilaterally Eyes:     Extraocular Movements: Extraocular movements intact.     Conjunctiva/sclera: Conjunctivae normal.     Pupils: Pupils are equal, round, and reactive to light.  Neck:     Comments: No midline tenderness to palpation of the cervical spine.  Range of motion intact Cardiovascular:     Rate and Rhythm: Normal rate and regular rhythm.     Heart sounds: No murmur heard. Pulmonary:     Effort: Pulmonary effort is normal. No respiratory distress.     Breath sounds: Normal breath sounds.  Chest:     Comments: Clavicles stable nontender to AP compression.  Chest wall stable and nontender to AP and lateral compression. Abdominal:     Palpations: Abdomen is soft.     Tenderness: There is no abdominal tenderness.     Comments: Pelvis stable to lateral compression  Musculoskeletal:     Cervical back: Neck supple.     Comments: No midline tenderness to palpation of the thoracic or lumbar spine.  Extremities atraumatic with intact range of motion  Skin:    General: Skin is warm and dry.  Neurological:     Mental Status: She is alert.     Comments: Cranial nerves II through  XII grossly intact.  Moving all 4 extremities spontaneously.  Sensation grossly intact all 4 extremities     ED Results / Procedures / Treatments   Labs (all labs ordered are listed, but only abnormal results are displayed) Labs Reviewed - No data to display  EKG None  Radiology CT Maxillofacial Wo Contrast Result Date: 05/19/2023 CLINICAL DATA:  Trauma. EXAM: CT MAXILLOFACIAL WITHOUT CONTRAST TECHNIQUE: Multidetector CT imaging of the maxillofacial structures was performed. Multiplanar CT image reconstructions were also generated. RADIATION DOSE REDUCTION: This exam was performed according to the departmental dose-optimization program which includes automated exposure control, adjustment of the mA and/or kV according to patient size and/or  use of iterative reconstruction technique. COMPARISON:  None Available. FINDINGS: There is left-sided zygomatic arch comminuted fracture without significant displacement. Fracture involving the posterior lateral wall of the left maxillary antrum extending to the medial wall and inferior orbital with of nondisplaced orbital wall fracture on the left. Fracture line crosses the midline to involve the right sided maxillary wall posteriorly. Right orbit and zygomatic arch are intact. Air-fluid level left maxillary antrum consistent with hemorrhage. Sinuses otherwise aerated and clear. Mastoids are aerated. IMPRESSION: Comminuted posterior maxillary fracture extending across the midline. Left-sided zygomatic arch fracture. Fracture extending to the left posteromedial and inferior orbital wall. Electronically Signed   By: Sydell Eva M.D.   On: 05/19/2023 21:37    Procedures Procedures    Medications Ordered in ED Medications  HYDROcodone-acetaminophen  (NORCO/VICODIN) 5-325 MG per tablet 1 tablet (has no administration in time range)  oxymetazoline (AFRIN) 0.05 % nasal spray 1 spray (1 spray Each Nare Given 05/19/23 2106)    ED Course/ Medical Decision Making/ A&P                                 Medical Decision Making Amount and/or Complexity of Data Reviewed Radiology: ordered.  Risk OTC drugs.     58 year old female presenting to the emergency department with a ground-level mechanical fall.  The patient states that she was dancing on a floor and was wearing socks only when she slipped landing on the left side of her face.  She landed on the left side of her body.  She sustained pain and bruising with associated nosebleed.  Nosebleed started approximately 30 minutes prior to arrival and has since resolved after Afrin.  She endorses pain along the left side of her face with associated bruising and swelling.  She endorses some bruising and discomfort along her left hip.  She is not on  anticoagulation, she arrives GCS 15, ABC intact.  On arrival, the patient was vitally stable, epistaxis had resolved.  Physical exam revealed evidence of facial swelling and tenderness, no nasal septal hematoma noted, no other evidence of traumatic injury on primary or secondary survey as per above.  Will obtain CT imaging of the face to further evaluate.  Patient declined pain medicine as her pain was 0.  CT Maxface: IMPRESSION:  Comminuted posterior maxillary fracture extending across the  midline. Left-sided zygomatic arch fracture. Fracture extending to  the left posteromedial and inferior orbital wall.    Fracture across the midline posteriorly across the pterygoid plates, however not fully a LeFort fracture extending bilaterally.   ENT: Discussed with Dr. Westley Hammers, patient stable for outpatient follow-up, no immediate intervention indicated tonight, plan for outpatient follow-up in 4 to 5 days once swelling has resolved, recommended ice to the affected area, elevate  head of bed, no nose blowing with pain medicine as needed.   Final Clinical Impression(s) / ED Diagnoses Final diagnoses:  Closed fracture of facial bone, unspecified facial bone, initial encounter (HCC)  Epistaxis due to trauma  Fall, initial encounter    Rx / DC Orders ED Discharge Orders          Ordered    HYDROcodone-acetaminophen  (NORCO/VICODIN) 5-325 MG tablet  Every 6 hours PRN        05/20/23 0009              Rosealee Concha, MD 05/20/23 0011

## 2023-05-19 NOTE — ED Provider Notes (Incomplete)
 [place] EMERGENCY DEPARTMENT AT [facility] Provider Note   CSN: [id number] Arrival date & time: 05/19/23  2057     History  Chief Complaint  Patient presents with  . Fall  . Epistaxis    Shelly Smith is a 58 y.o. female.   Fall  Epistaxis    58 year old female presenting to the emergency department with a ground-level mechanical fall.  The patient states that she was dancing on a floor and was wearing socks only when she slipped landing on the left side of her face.  She landed on the left side of her body.  She sustained pain and bruising with associated nosebleed.  Nosebleed started approximately 30 minutes prior to arrival and has since resolved after Afrin.  She endorses pain along the left side of her face with associated bruising and swelling.  She endorses some bruising and discomfort along her left hip.  She is not on anticoagulation, she arrives GCS 15, ABC intact.  Home Medications Prior to Admission medications   Medication Sig Start Date End Date Taking? Authorizing Provider  Probiotic Product (PROBIOTIC ADVANCED PO) Take by mouth.    [provider]  tamoxifen  (NOLVADEX ) 20 MG tablet Take 1 tablet (20 mg total) by mouth daily. 02/18/23   Sonja [place], MD      Allergies    No known allergies    Review of Systems   Review of Systems  HENT:  Positive for facial swelling and nosebleeds.     Physical Exam Updated Vital Signs BP 137/79 (BP Location: Right Arm)   Pulse 83   Temp 98.7 F (37.1 C) (Oral)   Resp 15   SpO2 99%  Physical Exam Vitals and nursing note reviewed.  Constitutional:      General: She is not in acute distress.    Appearance: She is well-developed.     Comments: GCS 15, ABC intact  HENT:     Head: Normocephalic and atraumatic.     Nose:     Comments: No nasal septal hematoma, epistaxis appears to have resolved with dried blood in the naris bilaterally Eyes:     Extraocular Movements: Extraocular movements intact.      Conjunctiva/sclera: Conjunctivae normal.     Pupils: Pupils are equal, round, and reactive to light.  Neck:     Comments: No midline tenderness to palpation of the cervical spine.  Range of motion intact Cardiovascular:     Rate and Rhythm: Normal rate and regular rhythm.     Heart sounds: No murmur heard. Pulmonary:     Effort: Pulmonary effort is normal. No respiratory distress.     Breath sounds: Normal breath sounds.  Chest:     Comments: Clavicles stable nontender to AP compression.  Chest wall stable and nontender to AP and lateral compression. Abdominal:     Palpations: Abdomen is soft.     Tenderness: There is no abdominal tenderness.     Comments: Pelvis stable to lateral compression  Musculoskeletal:     Cervical back: Neck supple.     Comments: No midline tenderness to palpation of the thoracic or lumbar spine.  Extremities atraumatic with intact range of motion  Skin:    General: Skin is warm and dry.  Neurological:     Mental Status: She is alert.     Comments: Cranial nerves II through XII grossly intact.  Moving all 4 extremities spontaneously.  Sensation grossly intact all 4 extremities     ED Results /  Procedures / Treatments   Labs (all labs ordered are listed, but only abnormal results are displayed) Labs Reviewed - No data to display  EKG None  Radiology CT Maxillofacial Wo Contrast Result Date: 05/19/2023 CLINICAL DATA:  Trauma. EXAM: CT MAXILLOFACIAL WITHOUT CONTRAST TECHNIQUE: Multidetector CT imaging of the maxillofacial structures was performed. Multiplanar CT image reconstructions were also generated. RADIATION DOSE REDUCTION: This exam was performed according to the departmental dose-optimization program which includes automated exposure control, adjustment of the mA and/or kV according to patient size and/or use of iterative reconstruction technique. COMPARISON:  None Available. FINDINGS: There is left-sided zygomatic arch comminuted fracture without  significant displacement. Fracture involving the posterior lateral wall of the left maxillary antrum extending to the medial wall and inferior orbital with of nondisplaced orbital wall fracture on the left. Fracture line crosses the midline to involve the right sided maxillary wall posteriorly. Right orbit and zygomatic arch are intact. Air-fluid level left maxillary antrum consistent with hemorrhage. Sinuses otherwise aerated and clear. Mastoids are aerated. IMPRESSION: Comminuted posterior maxillary fracture extending across the midline. Left-sided zygomatic arch fracture. Fracture extending to the left posteromedial and inferior orbital wall. Electronically Signed   By: Sydell Eva M.D.   On: 05/19/2023 21:37    Procedures Procedures    Medications Ordered in ED Medications  oxymetazoline (AFRIN) 0.05 % nasal spray 1 spray (1 spray Each Nare Given 05/19/23 2106)    ED Course/ Medical Decision Making/ A&P                                 Medical Decision Making Amount and/or Complexity of Data Reviewed Radiology: ordered.  Risk OTC drugs.     58 year old female presenting to the emergency department with a ground-level mechanical fall.  The patient states that she was dancing on a floor and was wearing socks only when she slipped landing on the left side of her face.  She landed on the left side of her body.  She sustained pain and bruising with associated nosebleed.  Nosebleed started approximately 30 minutes prior to arrival and has since resolved after Afrin.  She endorses pain along the left side of her face with associated bruising and swelling.  She endorses some bruising and discomfort along her left hip.  She is not on anticoagulation, she arrives GCS 15, ABC intact.  ***  {Document critical care time when appropriate:1} {Document review of labs and clinical decision tools ie heart score, Chads2Vasc2 etc:1}  {Document your independent review of radiology images, and any  outside records:1} {Document your discussion with family members, caretakers, and with consultants:1} {Document social determinants of health affecting pt's care:1} {Document your decision making why or why not admission, treatments were needed:1} Final Clinical Impression(s) / ED Diagnoses Final diagnoses:  None    Rx / DC Orders ED Discharge Orders     None

## 2023-05-19 NOTE — ED Triage Notes (Signed)
 Pt c/o mechanical fall just PTA, landed on L side of face. C/o nose bleed x approx .

## 2023-05-20 ENCOUNTER — Encounter: Payer: Self-pay | Admitting: Hematology

## 2023-05-20 MED ORDER — HYDROCODONE-ACETAMINOPHEN 5-325 MG PO TABS: 1.0000 | ORAL_TABLET | Freq: Four times a day (QID) | ORAL | 0 refills | Status: AC | PRN

## 2023-05-20 MED ORDER — HYDROCODONE-ACETAMINOPHEN 5-325 MG PO TABS
1.0000 | ORAL_TABLET | Freq: Four times a day (QID) | ORAL | Status: DC | PRN
  Administered 2023-05-20: 1 via ORAL
  Filled 2023-05-20: qty 1

## 2023-05-20 NOTE — Discharge Instructions (Addendum)
 You have sustained extensive left-sided facial fractures.  Conservative management and pain control is the mainstay of treatment at this time.  Recommend ice to the affected area, elevate head of bed, no nose blowing.  Pain medicine has been prescribed for breakthrough pain.  Follow-up with ENT in clinic in 4 to 5 days for repeat assessment once swelling has resolved.

## 2023-06-17 ENCOUNTER — Emergency Department (HOSPITAL_BASED_OUTPATIENT_CLINIC_OR_DEPARTMENT_OTHER)

## 2023-06-17 ENCOUNTER — Encounter (HOSPITAL_BASED_OUTPATIENT_CLINIC_OR_DEPARTMENT_OTHER): Payer: Self-pay

## 2023-06-17 ENCOUNTER — Other Ambulatory Visit: Payer: Self-pay

## 2023-06-17 ENCOUNTER — Emergency Department (HOSPITAL_BASED_OUTPATIENT_CLINIC_OR_DEPARTMENT_OTHER)
Admission: EM | Admit: 2023-06-17 | Discharge: 2023-06-17 | Disposition: A | Discharge status: Home / Self Care | Attending: Emergency Medicine | Admitting: Emergency Medicine

## 2023-06-17 DIAGNOSIS — C50919 Malignant neoplasm of unspecified site of unspecified female breast: Secondary | ICD-10-CM | POA: Diagnosis not present

## 2023-06-17 DIAGNOSIS — N390 Urinary tract infection, site not specified: Secondary | ICD-10-CM | POA: Diagnosis not present

## 2023-06-17 DIAGNOSIS — R319 Hematuria, unspecified: Secondary | ICD-10-CM | POA: Diagnosis present

## 2023-06-17 LAB — URINALYSIS, ROUTINE W REFLEX MICROSCOPIC
Bilirubin Urine: NEGATIVE
Glucose, UA: 100 mg/dL — AB
Ketones, ur: NEGATIVE mg/dL
Nitrite: NEGATIVE
Protein, ur: 100 mg/dL — AB
Specific Gravity, Urine: <1.005 — ABNORMAL LOW (ref 1.005–1.030)
WBC, UA: >50 WBC/hpf (ref 0–5)
pH: 6.5 (ref 5.0–8.0)

## 2023-06-17 LAB — BASIC METABOLIC PANEL WITH GFR
Anion gap: 12 (ref 5–15)
BUN: 11 mg/dL (ref 6–20)
CO2: 24 mmol/L (ref 22–32)
Calcium: 9.2 mg/dL (ref 8.9–10.3)
Chloride: 102 mmol/L (ref 98–111)
Creatinine, Ser: 0.7 mg/dL (ref 0.44–1.00)
GFR, Estimated: >60 mL/min (ref >60)
Glucose, Bld: 144 mg/dL — ABNORMAL HIGH (ref 70–99)
Potassium: 4.1 mmol/L (ref 3.5–5.1)
Sodium: 138 mmol/L (ref 135–145)

## 2023-06-17 LAB — CBC
HCT: 37.1 % (ref 36.0–46.0)
Hemoglobin: 13.2 g/dL (ref 12.0–15.0)
MCH: 33.8 pg (ref 26.0–34.0)
MCHC: 35.6 g/dL (ref 30.0–36.0)
MCV: 94.9 fL (ref 80.0–100.0)
Platelets: 109 10*3/uL — ABNORMAL LOW (ref 150–400)
RBC: 3.91 MIL/uL (ref 3.87–5.11)
RDW: 11.9 % (ref 11.5–15.5)
WBC: 9.1 10*3/uL (ref 4.0–10.5)
nRBC: 0 % (ref 0.0–0.2)

## 2023-06-17 MED ORDER — CEPHALEXIN 500 MG PO CAPS: 500.0000 mg | ORAL_CAPSULE | Freq: Two times a day (BID) | ORAL | 0 refills | Status: AC

## 2023-06-17 NOTE — Discharge Instructions (Signed)
 I have sent antibiotics to your pharmacy to treat your UTI.  Please follow-up with your oncologist regarding this ED visit.  Please call your PCP and/or return to the ED if symptoms do not improve after antibiotic completion or if you develop worsening blood in your urine.

## 2023-06-17 NOTE — ED Provider Notes (Signed)
 I saw and evaluated the patient, reviewed the resident's note and I agree with the findings and plan.   57 year old female presents with hematuria since yesterday.  Does note some urinary symptoms.  Concern for possible intrinsic kidney versus bladder pathology.  Will check labs and CT renal stone   Lind Repine, MD 06/17/23 [phone]

## 2023-06-17 NOTE — ED Provider Notes (Signed)
 [place] EMERGENCY DEPARTMENT AT [facility] Provider Note   CSN: [id number] Arrival date & time: 06/17/23  0915     History  Chief Complaint  Patient presents with   Hematuria      Shelly Smith is a 58 year old female who presents with hematuria with dysuria since yesterday. No prior history with this. Denies fevers, chills, rash, vaginal discharge.     Hematuria Pertinent negatives include no chest pain.       Home Medications Prior to Admission medications   Medication Sig Start Date End Date Taking? Authorizing Provider  alendronate (FOSAMAX) 70 MG tablet Take 70 mg by mouth once a week. 11/05/22  Yes [provider]  cephALEXin (KEFLEX) 500 MG capsule Take 1 capsule (500 mg total) by mouth in the morning and at bedtime. 06/17/23  Yes Ronni Colace, DO  HYDROcodone -acetaminophen  (NORCO/VICODIN) 5-325 MG tablet Take 1-2 tablets by mouth every 6 (six) hours as needed. 05/20/23   Rosealee Concha, MD  Probiotic Product (PROBIOTIC ADVANCED PO) Take by mouth.    [provider]  tamoxifen  (NOLVADEX ) 20 MG tablet Take 1 tablet (20 mg total) by mouth daily. 02/18/23   Sonja [place], MD      Allergies    No known allergies    Review of Systems   Review of Systems  Constitutional:  Negative for chills.  Cardiovascular:  Negative for chest pain.  Gastrointestinal:  Negative for constipation, diarrhea and nausea.  Genitourinary:  Positive for hematuria.    Physical Exam Updated Vital Signs BP 120/73   Pulse 90   Temp 98.4 F (36.9 C) (Oral)   Resp 16   Ht 5' 3.5" (1.613 m)   Wt 48.4 kg   SpO2 100%   BMI 18.60 kg/m  Physical Exam Constitutional:      Appearance: Normal appearance.  Cardiovascular:     Rate and Rhythm: Normal rate and regular rhythm.     Heart sounds: No murmur heard.    No friction rub. No gallop.  Pulmonary:     Effort: Pulmonary effort is normal. No respiratory distress.     Breath sounds: Normal breath sounds. No wheezing.   Abdominal:     General: Bowel sounds are normal. There is no distension.     Palpations: Abdomen is soft.     Tenderness: There is no abdominal tenderness. There is no right CVA tenderness, left CVA tenderness or guarding.  Skin:    General: Skin is warm and dry.  Neurological:     Mental Status: She is alert.     ED Results / Procedures / Treatments   Labs (all labs ordered are listed, but only abnormal results are displayed) Labs Reviewed  CBC - Abnormal; Notable for the following components:      Result Value   Platelets 109 (*)    All other components within normal limits  BASIC METABOLIC PANEL WITH GFR - Abnormal; Notable for the following components:   Glucose, Bld 144 (*)    All other components within normal limits  URINALYSIS, ROUTINE W REFLEX MICROSCOPIC - Abnormal; Notable for the following components:   Color, Urine BROWN (*)    APPearance HAZY (*)    Specific Gravity, Urine <1.005 (*)    Glucose, UA 100 (*)    Hgb urine dipstick LARGE (*)    Protein, ur 100 (*)    Leukocytes,Ua LARGE (*)    Bacteria, UA RARE (*)    Non Squamous Epithelial 0-5 (*)  All other components within normal limits    EKG None  Radiology CT Renal Stone Study Result Date: 06/17/2023 CLINICAL DATA:  Renal stone EXAM: CT ABDOMEN AND PELVIS WITHOUT CONTRAST TECHNIQUE: Multidetector CT imaging of the abdomen and pelvis was performed following the standard protocol without IV contrast. RADIATION DOSE REDUCTION: This exam was performed according to the departmental dose-optimization program which includes automated exposure control, adjustment of the mA and/or kV according to patient size and/or use of iterative reconstruction technique. COMPARISON:  None Available. FINDINGS: Lower chest: No acute abnormality. Hepatobiliary: Mild diffuse fatty liver changes without parenchymal lesions or biliary dilatation. Possible biliary sludge without acute cholecystitis or obvious gallstones. Pancreas:  Unremarkable. No pancreatic ductal dilatation or surrounding inflammatory changes. Spleen: Normal in size without focal abnormality. Adrenals/Urinary Tract: Adrenal glands are unremarkable. Kidneys are normal, without renal calculi, focal lesion, or hydronephrosis. Bladder is unremarkable. Stomach/Bowel: Stomach is within normal limits. Appendix appears normal. No evidence of bowel wall thickening, distention, or inflammatory changes. Vascular/Lymphatic: No significant vascular findings are present. No enlarged abdominal or pelvic lymph nodes. Reproductive: Uterus and bilateral adnexa are unremarkable. Other: No abdominal wall hernia or abnormality. No abdominopelvic ascites. Musculoskeletal: No acute or significant osseous findings. IMPRESSION: *No acute abdominopelvic abnormality. *Mild diffuse fatty liver changes. *Possible biliary sludge without acute cholecystitis or obvious gallstones. Electronically Signed   By: Fredrich Jefferson M.D.   On: 06/17/2023 11:44    Procedures Procedures    Medications Ordered in ED Medications - No data to display  ED Course/ Medical Decision Making/ A&P   {                                Medical Decision Making Initial Impression: 58 year old female who presents with hematuria and dysuria.  She does have a history of breast cancer status post mastectomy with current tamoxifen  therapy.  Differential includes cystitis, pyelonephritis, nephrolithiasis, intrinsic bladder pathology.  Will check UA and CT renal study.  Update: UA with evidence of UTI and CT renal unremarkable.  Will provide 5-day course of Keflex with return precautions given.  Amount and/or Complexity of Data Reviewed Labs: ordered. Radiology: ordered.  Risk Prescription drug management.    Final Clinical Impression(s) / ED Diagnoses Final diagnoses:  None    Rx / DC Orders ED Discharge Orders          Ordered    cephALEXin (KEFLEX) 500 MG capsule  2 times daily        06/17/23 1218               Ronni Colace, DO 06/17/23 1222    Lind Repine, MD 06/17/23 1520

## 2023-06-17 NOTE — ED Triage Notes (Addendum)
 Patient comes in from a walk-in clinic for hematuria. They suggested that she come here. She states it there has been more frequency and then noticed bright red blood and denies seeing clots. She reports no pain at the beginning of the stream but feels some discomfort at the end. She has no history of this in the past.

## 2023-09-24 ENCOUNTER — Other Ambulatory Visit: Payer: Self-pay | Admitting: Obstetrics and Gynecology

## 2023-09-24 DIAGNOSIS — R9230 Dense breasts, unspecified: Secondary | ICD-10-CM

## 2023-09-24 DIAGNOSIS — Z853 Personal history of malignant neoplasm of breast: Secondary | ICD-10-CM

## 2023-10-24 ENCOUNTER — Ambulatory Visit
Admission: RE | Admit: 2023-10-24 | Discharge: 2023-10-24 | Disposition: A | Discharge status: Home / Self Care | Source: Ambulatory Visit | Attending: Obstetrics and Gynecology | Admitting: Obstetrics and Gynecology

## 2023-10-24 DIAGNOSIS — R9230 Dense breasts, unspecified: Secondary | ICD-10-CM

## 2023-10-24 DIAGNOSIS — Z853 Personal history of malignant neoplasm of breast: Secondary | ICD-10-CM

## 2023-10-24 MED ORDER — GADOPICLENOL 0.5 MMOL/ML IV SOLN
5.0000 mL | Freq: Once | INTRAVENOUS | Status: AC | PRN
  Administered 2023-10-24: 5 mL via INTRAVENOUS

## 2023-10-29 ENCOUNTER — Other Ambulatory Visit: Payer: Self-pay | Admitting: Obstetrics and Gynecology

## 2023-10-29 DIAGNOSIS — Z1231 Encounter for screening mammogram for malignant neoplasm of breast: Secondary | ICD-10-CM

## 2023-10-30 ENCOUNTER — Other Ambulatory Visit: Payer: Self-pay | Admitting: Obstetrics and Gynecology

## 2023-10-30 DIAGNOSIS — K769 Liver disease, unspecified: Secondary | ICD-10-CM

## 2023-11-12 ENCOUNTER — Other Ambulatory Visit

## 2023-11-18 ENCOUNTER — Telehealth: Payer: Self-pay

## 2023-11-18 NOTE — Telephone Encounter (Signed)
 Faxed a copy of Pharmacy PA to the pharmacy to be approved for Tamoxifen  20 mg. Received confirmation of receipt.

## 2023-11-19 ENCOUNTER — Telehealth: Payer: Self-pay

## 2023-11-19 ENCOUNTER — Other Ambulatory Visit (HOSPITAL_COMMUNITY): Payer: Self-pay

## 2023-11-19 NOTE — Telephone Encounter (Signed)
 Oral Oncology Patient Advocate Encounter   Received notification that prior authorization for Tamoxifen  is required.   PA submitted on 11/19/23 Key [id number] Status is pending      Charlott Hamilton,  CPhT-Adv  she/her/hers [facility]  [facility] Pharmacy Technician Patient Advocate Specialist [facility] Phone: [phone]  Fax: [phone] [email or web address]

## 2023-11-19 NOTE — Telephone Encounter (Signed)
 Oral Oncology Patient Advocate Encounter  Prior Authorization for Tamoxifen  has been approved.    PA# Key [id number]   Patient is filling this  prescription at [facility]    Armiyah Capron,  CPhT-Adv  she/her/hers [facility]  [facility] Pharmacy Technician Patient Advocate Specialist III [facility] Phone: [phone]  Fax: [phone] [email or web address]

## 2023-12-02 ENCOUNTER — Ambulatory Visit
Admission: RE | Admit: 2023-12-02 | Discharge: 2023-12-02 | Disposition: A | Source: Ambulatory Visit | Attending: Obstetrics and Gynecology | Admitting: Obstetrics and Gynecology

## 2023-12-02 DIAGNOSIS — K769 Liver disease, unspecified: Secondary | ICD-10-CM

## 2023-12-02 MED ORDER — GADOPICLENOL 0.5 MMOL/ML IV SOLN
4.5000 mL | Freq: Once | INTRAVENOUS | Status: AC | PRN
  Administered 2023-12-02: 4.5 mL via INTRAVENOUS

## 2024-02-19 ENCOUNTER — Ambulatory Visit: Payer: 59 | Admitting: Hematology

## 2024-02-19 ENCOUNTER — Other Ambulatory Visit: Payer: 59

## 2024-02-26 ENCOUNTER — Inpatient Hospital Stay

## 2024-02-26 ENCOUNTER — Inpatient Hospital Stay: Admitting: Hematology
